# Patient Record
Sex: Male | Born: 1955 | Race: White | Hispanic: No | Marital: Single | Smoking: Never smoker
Health system: Southern US, Community
[De-identification: ages and names within clinical notes are randomized; demographics above are authoritative.]

## PROBLEM LIST (undated history)

## (undated) DIAGNOSIS — C61 Malignant neoplasm of prostate: Principal | ICD-10-CM

## (undated) DIAGNOSIS — R748 Abnormal levels of other serum enzymes: Principal | ICD-10-CM

## (undated) DIAGNOSIS — K2970 Gastritis, unspecified, without bleeding: Secondary | ICD-10-CM

## (undated) DIAGNOSIS — K649 Unspecified hemorrhoids: Secondary | ICD-10-CM

## (undated) DIAGNOSIS — K2980 Duodenitis without bleeding: Secondary | ICD-10-CM

## (undated) DIAGNOSIS — K209 Esophagitis, unspecified without bleeding: Secondary | ICD-10-CM

## (undated) DIAGNOSIS — K921 Melena: Secondary | ICD-10-CM

## (undated) DIAGNOSIS — E079 Disorder of thyroid, unspecified: Secondary | ICD-10-CM

## (undated) DIAGNOSIS — D369 Benign neoplasm, unspecified site: Secondary | ICD-10-CM

## (undated) DIAGNOSIS — K5790 Diverticulosis of intestine, part unspecified, without perforation or abscess without bleeding: Secondary | ICD-10-CM

## (undated) DIAGNOSIS — K219 Gastro-esophageal reflux disease without esophagitis: Secondary | ICD-10-CM

## (undated) HISTORY — DX: Melena: K92.1

## (undated) HISTORY — DX: Disorder of thyroid, unspecified: E07.9

## (undated) HISTORY — PX: TONSILECTOMY, ADENOIDECTOMY, BILATERAL MYRINGOTOMY AND TUBES: SHX2538

## (undated) HISTORY — PX: COLONOSCOPY: SHX174

## (undated) HISTORY — DX: Esophagitis, unspecified without bleeding: K20.90

## (undated) HISTORY — DX: Benign neoplasm, unspecified site: D36.9

## (undated) HISTORY — PX: UPPER GASTROINTESTINAL ENDOSCOPY: SHX188

## (undated) HISTORY — DX: Unspecified hemorrhoids: K64.9

## (undated) HISTORY — PX: HERNIA REPAIR: SHX51

## (undated) HISTORY — DX: Duodenitis without bleeding: K29.80

## (undated) HISTORY — DX: Gastritis, unspecified, without bleeding: K29.70

## (undated) HISTORY — DX: Gastro-esophageal reflux disease without esophagitis: K21.9

## (undated) HISTORY — DX: Diverticulosis of intestine, part unspecified, without perforation or abscess without bleeding: K57.90

---

## 1999-07-27 ENCOUNTER — Ambulatory Visit (HOSPITAL_COMMUNITY): Admission: RE | Admit: 1999-07-27 | Discharge: 1999-07-27 | Payer: Self-pay | Admitting: Gastroenterology

## 1999-10-02 ENCOUNTER — Ambulatory Visit (HOSPITAL_COMMUNITY): Admission: RE | Admit: 1999-10-02 | Discharge: 1999-10-02 | Payer: Self-pay | Admitting: Gastroenterology

## 2001-09-06 ENCOUNTER — Ambulatory Visit (HOSPITAL_COMMUNITY): Admission: RE | Admit: 2001-09-06 | Discharge: 2001-09-06 | Payer: Self-pay | Admitting: Surgery

## 2003-07-24 ENCOUNTER — Ambulatory Visit (HOSPITAL_COMMUNITY): Admission: RE | Admit: 2003-07-24 | Discharge: 2003-07-24 | Payer: Self-pay | Admitting: Gastroenterology

## 2013-01-22 ENCOUNTER — Ambulatory Visit (INDEPENDENT_AMBULATORY_CARE_PROVIDER_SITE_OTHER): Payer: BC Managed Care – PPO | Admitting: Family Medicine

## 2013-01-22 ENCOUNTER — Ambulatory Visit: Payer: BC Managed Care – PPO

## 2013-01-22 VITALS — BP 102/58 | HR 68 | Temp 98.3°F | Resp 16 | Ht 68.0 in | Wt 142.2 lb

## 2013-01-22 DIAGNOSIS — R059 Cough, unspecified: Secondary | ICD-10-CM

## 2013-01-22 DIAGNOSIS — H6121 Impacted cerumen, right ear: Secondary | ICD-10-CM

## 2013-01-22 DIAGNOSIS — H9193 Unspecified hearing loss, bilateral: Secondary | ICD-10-CM

## 2013-01-22 DIAGNOSIS — R05 Cough: Secondary | ICD-10-CM

## 2013-01-22 DIAGNOSIS — H9190 Unspecified hearing loss, unspecified ear: Secondary | ICD-10-CM

## 2013-01-22 DIAGNOSIS — H6590 Unspecified nonsuppurative otitis media, unspecified ear: Secondary | ICD-10-CM

## 2013-01-22 DIAGNOSIS — H6120 Impacted cerumen, unspecified ear: Secondary | ICD-10-CM

## 2013-01-22 LAB — POCT CBC
GRANULOCYTE PERCENT: 68.6 % (ref 37–80)
HCT, POC: 43.6 % (ref 43.5–53.7)
HEMOGLOBIN: 13.7 g/dL — AB (ref 14.1–18.1)
Lymph, poc: 1.4 (ref 0.6–3.4)
MCH, POC: 29.9 pg (ref 27–31.2)
MCHC: 31.4 g/dL — AB (ref 31.8–35.4)
MCV: 95.2 fL (ref 80–97)
MID (CBC): 0.4 (ref 0–0.9)
MPV: 10 fL (ref 0–99.8)
POC GRANULOCYTE: 3.9 (ref 2–6.9)
POC LYMPH PERCENT: 24.8 %L (ref 10–50)
POC MID %: 6.6 %M (ref 0–12)
Platelet Count, POC: 210 10*3/uL (ref 142–424)
RBC: 4.58 M/uL — AB (ref 4.69–6.13)
RDW, POC: 14.1 %
WBC: 5.7 10*3/uL (ref 4.6–10.2)

## 2013-01-22 MED ORDER — METHYLPREDNISOLONE 4 MG PO KIT: PACK | ORAL | Status: DC

## 2013-01-22 MED ORDER — CEFDINIR 300 MG PO CAPS: 300.0000 mg | ORAL_CAPSULE | Freq: Two times a day (BID) | ORAL | Status: DC

## 2013-01-22 NOTE — Progress Notes (Addendum)
This chart was scribed for Delman Cheadle, MD by Einar Pheasant, ED Scribe. This patient was seen in room 9 and the patient's care was started at 4:21 PM. Subjective:    Patient ID: Jon Leonard, male    DOB: [birth year], 58 y.o.   MRN: [id number] Chief Complaint  Patient presents with  . Otalgia    both ears    HPI HPI Comments: Jon Leonard is a 58 y.o. male who presents to [facility] complaining of ears feeling congested that started 1 week ago. He states that he had a URI and sinus cong about a month ago, then got the flu over the christmas w/ full blown f/c/myalgias.  He has been taking OTC medication with some relief and everything has largely improved other than worsening hearing loss. He denies sinus pressure, ear pain, fever, chills, and sore throat. He still has a little cough and chest congestion but that is improving as well.  Pt states that he has been prescribed amoxicillin before with no allergic reactions.  Pcn allergy was when he was a young child.  History reviewed. No pertinent past medical history. Allergies  Allergen Reactions  . Penicillins    No current outpatient prescriptions on file prior to visit.   No current facility-administered medications on file prior to visit.    Review of Systems  Constitutional: Negative for fever, chills, diaphoresis, activity change, appetite change, fatigue and unexpected weight change.  HENT: Positive for congestion, ear discharge and hearing loss. Negative for ear pain, facial swelling, mouth sores, postnasal drip, rhinorrhea, sinus pressure and sore throat.   Respiratory: Positive for cough. Negative for chest tightness, shortness of breath and wheezing.   Cardiovascular: Negative for chest pain.  Neurological: Positive for headaches. Negative for syncope.  Hematological: Negative for adenopathy.      Triage vitals: BP 102/58  Pulse 68  Temp(Src) 98.3 F (36.8 C) (Oral)  Resp 16  Ht 5\' 8"  (1.727 m)   Wt 142 lb 3.2 oz (64.501 kg)  BMI 21.63 kg/m2  SpO2 97% Objective:   Physical Exam  Nursing note and vitals reviewed. Constitutional: He is oriented to person, place, and time. He appears well-developed and well-nourished.  HENT:  Head: Normocephalic and atraumatic.  Right Ear: Tympanic membrane is injected, erythematous and retracted. A middle ear effusion is present.  Left Ear: Tympanic membrane is injected and retracted. A middle ear effusion is present.  Cardiovascular: Normal rate.   Pulmonary/Chest: Effort normal. He has wheezes. He has rhonchi in the left lower field. He has no rales.  Inspiratory and expiratory wheezing worse bibasillarly.  Abdominal: He exhibits no distension.  Lymphadenopathy:    He has no cervical adenopathy.    He has no axillary adenopathy.  Neurological: He is alert and oriented to person, place, and time.  Skin: Skin is warm and dry.  Psychiatric: He has a normal mood and affect.     Results for orders placed in visit on 01/22/13  POCT CBC      Result Value Range   WBC 5.7  4.6 - 10.2 K/uL   Lymph, poc 1.4  0.6 - 3.4   POC LYMPH PERCENT 24.8  10 - 50 %L   MID (cbc) 0.4  0 - 0.9   POC MID % 6.6  0 - 12 %M   POC Granulocyte 3.9  2 - 6.9   Granulocyte percent 68.6  37 - 80 %G   RBC 4.58 (*) 4.69 -  6.13 M/uL   Hemoglobin 13.7 (*) 14.1 - 18.1 g/dL   HCT, POC 43.6  43.5 - 53.7 %   MCV 95.2  80 - 97 fL   MCH, POC 29.9  27 - 31.2 pg   MCHC 31.4 (*) 31.8 - 35.4 g/dL   RDW, POC 14.1     Platelet Count, POC 210  142 - 424 K/uL   MPV 10.0  0 - 99.8 fL    Primary Chest X-ray reading by Dr. Brigitte Pulse No acute abnormalities    EXAM: CHEST 2 VIEW  COMPARISON: None.  FINDINGS: The heart size and mediastinal contours are within normal limits. Both lungs are clear. No pleural effusion or pneumothorax is noted. Old left rib fractures are noted.  IMPRESSION: No active cardiopulmonary disease.  Assessment & Plan:  Cough - Plan: POCT CBC, DG Chest 2  View  Hearing loss, bilateral  Cerumen impaction, right - cerumen completely removed by lavage.  Serous otitis media, left and otitis media right - omnicef  Eustachian tube dysfunction - medrol dose pack  Meds ordered this encounter  Medications  . methylPREDNISolone (MEDROL, PAK,) 4 MG tablet    Sig: follow package directions    Dispense:  21 tablet    Refill:  0  . cefdinir (OMNICEF) 300 MG capsule    Sig: Take 1 capsule (300 mg total) by mouth 2 (two) times daily.    Dispense:  20 capsule    Refill:  0    I personally performed the services described in this documentation, which was scribed in my presence. The recorded information has been reviewed and considered, and addended by me as needed.  Delman Cheadle, MD MPH

## 2013-01-22 NOTE — Patient Instructions (Signed)
Otitis Media, Adult Otitis media is redness, soreness, and swelling (inflammation) of the middle ear. Otitis media may be caused by allergies or, most commonly, by infection. Often it occurs as a complication of the common cold. SIGNS AND SYMPTOMS Symptoms of otitis media may include:  Earache.  Fever.  Ringing in your ear.  Headache.  Leakage of fluid from the ear. DIAGNOSIS To diagnose otitis media, your health care provider will examine your ear with an otoscope. This is an instrument that allows your health care provider to see into your ear in order to examine your eardrum. Your health care provider also will ask you questions about your symptoms. TREATMENT  Typically, otitis media resolves on its own within 3 5 days. Your health care provider may prescribe medicine to ease your symptoms of pain. If otitis media does not resolve within 5 days or is recurrent, your health care provider may prescribe antibiotic medicines if he or she suspects that a bacterial infection is the cause. HOME CARE INSTRUCTIONS   Take your medicine as directed until it is gone, even if you feel better after the first few days.  Only take over-the-counter or prescription medicines for pain, discomfort, or fever as directed by your health care provider.  Follow up with your health care provider as directed. SEEK MEDICAL CARE IF:  You have otitis media only in one ear or bleeding from your nose or both.  You notice a lump on your neck.  You are not getting better in 3 5 days.  You feel worse instead of better. SEEK IMMEDIATE MEDICAL CARE IF:   You have pain that is not controlled with medicine.  You have swelling, redness, or pain around your ear or stiffness in your neck.  You notice that part of your face is paralyzed.  You notice that the bone behind your ear (mastoid) is tender when you touch it. MAKE SURE YOU:   Understand these instructions.  Will watch your condition.  Will get help  right away if you are not doing well or get worse. Document Released: 10/03/2003 Document Revised: 10/18/2012 Document Reviewed: 07/25/2012 [facility] Patient Information 2014 [place], [place].   Otitis Media With Effusion Otitis media with effusion is the presence of fluid in the middle ear. This is a common problem in children, which often follows ear infections. It may be present for weeks or longer after the infection. Unlike an acute ear infection, otitis media with effusion refers only to fluid behind the ear drum and not infection. Children with repeated ear and sinus infections and allergy problems are the most likely to get otitis media with effusion. CAUSES  The most frequent cause of the fluid buildup is dysfunction of the eustachian tubes. These are the tubes that drain fluid in the ears to the to the back of the nose (nasopharynx). SYMPTOMS   The main symptom of this condition is hearing loss. As a result, you or your child may:  Listen to the TV at a loud volume.  Not respond to questions.  Ask "what" often when spoken to.  Mistake or confuse on sound or word for another.  There may be a sensation of fullness or pressure but usually not pain. DIAGNOSIS   Your health care provider will diagnose this condition by examining you or your child's ears.  Your health care provider may test the pressure in you or your child's ear with a tympanometer.  A hearing test may be conducted if the problem persists. TREATMENT  Treatment depends on the duration and the effects of the effusion.  Antibiotics, decongestants, nose drops, and cortisone-type drugs (tablets or nasal spray) may not be helpful.  Children with persistent ear effusions may have delayed language or behavioral problems. Children at risk for developmental delays in hearing, learning, and speech may require referral to a specialist earlier than children not at risk.  You or your child's health care provider may  suggest a referral to an ear, nose, and throat surgeon for treatment. The following may help restore normal hearing:  Drainage of fluid.  Placement of ear tubes (tympanostomy tubes).  Removal of adenoids (adenoidectomy). HOME CARE INSTRUCTIONS   Avoid second hand smoke.  Infants who are breast fed are less likely to have this condition.  Avoid feeding infants while laying flat.  Avoid known environmental allergens.  Avoid people who are sick. SEEK MEDICAL CARE IF:   Hearing is not better in 3 months.  Hearing is worse.  Ear pain.  Drainage from the ear.  Dizziness. MAKE SURE YOU:   Understand these instructions.  Will watch your condition.  Will get help right away if you are not doing well or get worse. Document Released: 02/05/2004 Document Revised: 10/18/2012 Document Reviewed: 07/25/2012 [facility] Patient Information 2014 [place], [place].   Serous Otitis Media  Serous otitis media is fluid in the middle ear space. This space contains the bones for hearing and air. Air in the middle ear space helps to transmit sound.  The air gets there through the eustachian tube. This tube goes from the back of the nose (nasopharynx) to the middle ear space. It keeps the pressure in the middle ear the same as the outside world. It also helps to drain fluid from the middle ear space. CAUSES  Serous otitis media occurs when the eustachian tube gets blocked. Blockage can come from:  Ear infections.  Colds and other upper respiratory infections.  Allergies.  Irritants such as cigarette smoke.  Sudden changes in air pressure (such as descending in an airplane).  Enlarged adenoids.  A mass in the nasopharynx. During colds and upper respiratory infections, the middle ear space can become temporarily filled with fluid. This can happen after an ear infection also. Once the infection clears, the fluid will generally drain out of the ear through the eustachian tube. If it does not,  then serous otitis media occurs. SIGNS AND SYMPTOMS   Hearing loss.  A feeling of fullness in the ear, without pain.  Young children may not show any symptoms but may show slight behavioral changes, such as agitation, ear pulling, or crying. DIAGNOSIS  Serous otitis media is diagnosed by an ear exam. Tests may be done to check on the movement of the eardrum. Hearing exams may also be done. TREATMENT  The fluid most often goes away without treatment. If allergy is the cause, allergy treatment may be helpful. Fluid that persists for several months may require minor surgery. A small tube is placed in the eardrum to:  Drain the fluid.  Restore the air in the middle ear space. In certain situations, antibiotics are used to avoid surgery. Surgery may be done to remove enlarged adenoids (if this is the cause). HOME CARE INSTRUCTIONS   Keep children away from tobacco smoke.  Be sure to keep any follow-up appointments. SEEK MEDICAL CARE IF:   Your hearing is not better in 3 months.  Your hearing is worse.  You have ear pain.  You have drainage from the ear.  You have dizziness.  You have serous otitis media only in one ear or have any bleeding from your nose (epistaxis).  You notice a lump on your neck. MAKE SURE YOU:  Understand these instructions.   Will watch your condition.   Will get help right away if you are not doing well or get worse.  Document Released: 03/20/2003 Document Revised: 08/30/2012 Document Reviewed: 07/25/2012 [facility] Patient Information 2014 [place], [place].

## 2014-06-28 IMAGING — CR DG CHEST 2V
2 series · 2 of 2 positions shown · non-contrast
Comparison: None.

CLINICAL DATA: Cough.

EXAM:
CHEST  2 VIEW

[PA]
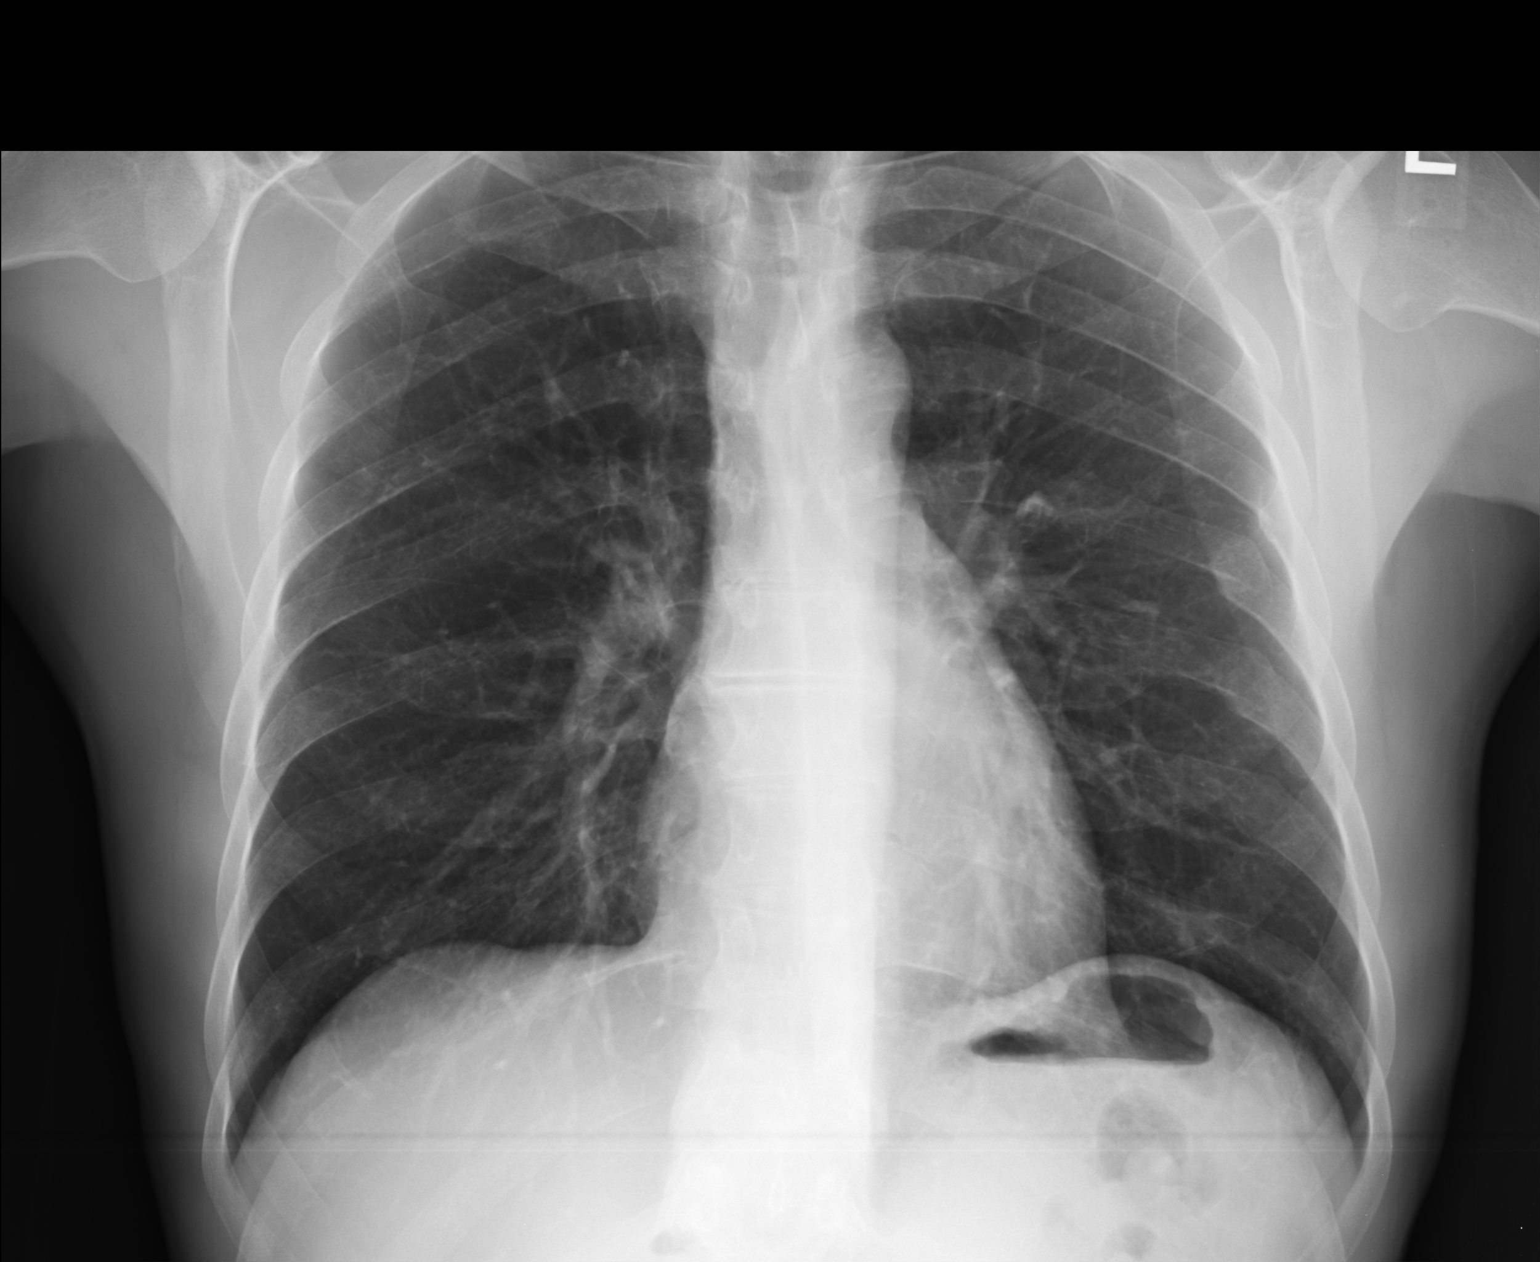

[lateral]
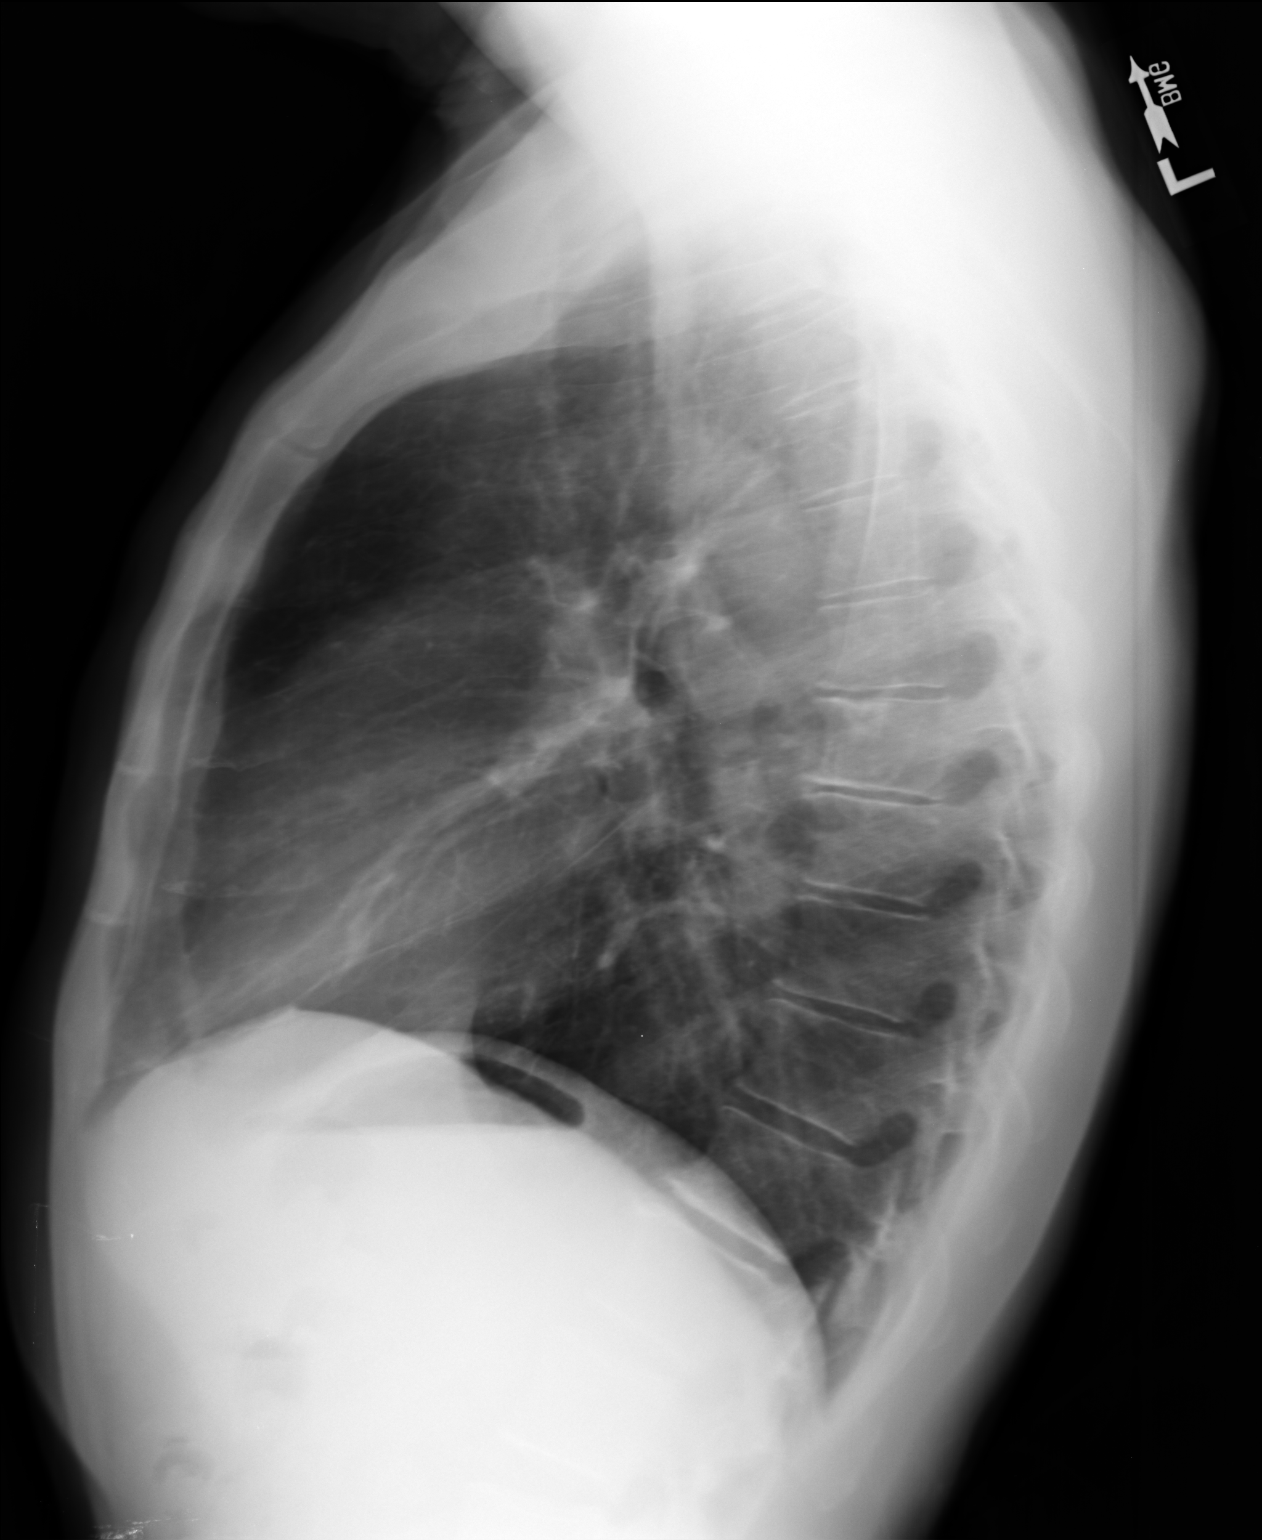

[2 of 2 positions shown; findings below may reference images not displayed]

FINDINGS: The heart size and mediastinal contours are within normal limits.
Both lungs are clear. No pleural effusion or pneumothorax is noted.
Old left rib fractures are noted.
IMPRESSION: No active cardiopulmonary disease.

## 2015-10-10 DIAGNOSIS — Z0000 Encounter for general adult medical examination without abnormal findings: Secondary | ICD-10-CM | POA: Diagnosis not present

## 2015-10-10 DIAGNOSIS — Z125 Encounter for screening for malignant neoplasm of prostate: Secondary | ICD-10-CM | POA: Diagnosis not present

## 2015-10-10 DIAGNOSIS — E038 Other specified hypothyroidism: Secondary | ICD-10-CM | POA: Diagnosis not present

## 2015-10-17 DIAGNOSIS — Z1212 Encounter for screening for malignant neoplasm of rectum: Secondary | ICD-10-CM | POA: Diagnosis not present

## 2015-10-17 DIAGNOSIS — F329 Major depressive disorder, single episode, unspecified: Secondary | ICD-10-CM | POA: Diagnosis not present

## 2015-10-17 DIAGNOSIS — Z0000 Encounter for general adult medical examination without abnormal findings: Secondary | ICD-10-CM | POA: Diagnosis not present

## 2015-10-17 DIAGNOSIS — Z1389 Encounter for screening for other disorder: Secondary | ICD-10-CM | POA: Diagnosis not present

## 2015-10-17 DIAGNOSIS — E038 Other specified hypothyroidism: Secondary | ICD-10-CM | POA: Diagnosis not present

## 2015-10-17 DIAGNOSIS — E784 Other hyperlipidemia: Secondary | ICD-10-CM | POA: Diagnosis not present

## 2015-11-11 DIAGNOSIS — Z23 Encounter for immunization: Secondary | ICD-10-CM | POA: Diagnosis not present

## 2016-01-15 DIAGNOSIS — C44319 Basal cell carcinoma of skin of other parts of face: Secondary | ICD-10-CM | POA: Diagnosis not present

## 2016-01-15 DIAGNOSIS — L309 Dermatitis, unspecified: Secondary | ICD-10-CM | POA: Diagnosis not present

## 2016-01-15 DIAGNOSIS — Z85828 Personal history of other malignant neoplasm of skin: Secondary | ICD-10-CM | POA: Diagnosis not present

## 2016-01-15 DIAGNOSIS — D1801 Hemangioma of skin and subcutaneous tissue: Secondary | ICD-10-CM | POA: Diagnosis not present

## 2016-01-15 DIAGNOSIS — L821 Other seborrheic keratosis: Secondary | ICD-10-CM | POA: Diagnosis not present

## 2016-02-05 DIAGNOSIS — C44319 Basal cell carcinoma of skin of other parts of face: Secondary | ICD-10-CM | POA: Diagnosis not present

## 2016-04-12 DIAGNOSIS — H524 Presbyopia: Secondary | ICD-10-CM | POA: Diagnosis not present

## 2016-04-12 DIAGNOSIS — H5310 Unspecified subjective visual disturbances: Secondary | ICD-10-CM | POA: Diagnosis not present

## 2016-04-12 DIAGNOSIS — H02403 Unspecified ptosis of bilateral eyelids: Secondary | ICD-10-CM | POA: Diagnosis not present

## 2016-04-12 DIAGNOSIS — H4912 Fourth [trochlear] nerve palsy, left eye: Secondary | ICD-10-CM | POA: Diagnosis not present

## 2016-10-12 DIAGNOSIS — E038 Other specified hypothyroidism: Secondary | ICD-10-CM | POA: Diagnosis not present

## 2016-10-12 DIAGNOSIS — Z125 Encounter for screening for malignant neoplasm of prostate: Secondary | ICD-10-CM | POA: Diagnosis not present

## 2016-10-12 DIAGNOSIS — Z0000 Encounter for general adult medical examination without abnormal findings: Secondary | ICD-10-CM | POA: Diagnosis not present

## 2016-10-13 DIAGNOSIS — Z0000 Encounter for general adult medical examination without abnormal findings: Secondary | ICD-10-CM | POA: Diagnosis not present

## 2016-10-18 DIAGNOSIS — Z0000 Encounter for general adult medical examination without abnormal findings: Secondary | ICD-10-CM | POA: Diagnosis not present

## 2016-10-18 DIAGNOSIS — E7849 Other hyperlipidemia: Secondary | ICD-10-CM | POA: Diagnosis not present

## 2016-10-18 DIAGNOSIS — Z1389 Encounter for screening for other disorder: Secondary | ICD-10-CM | POA: Diagnosis not present

## 2016-10-18 DIAGNOSIS — E039 Hypothyroidism, unspecified: Secondary | ICD-10-CM | POA: Diagnosis not present

## 2016-10-18 DIAGNOSIS — Z23 Encounter for immunization: Secondary | ICD-10-CM | POA: Diagnosis not present

## 2016-10-18 DIAGNOSIS — F329 Major depressive disorder, single episode, unspecified: Secondary | ICD-10-CM | POA: Diagnosis not present

## 2016-10-18 DIAGNOSIS — N401 Enlarged prostate with lower urinary tract symptoms: Secondary | ICD-10-CM | POA: Diagnosis not present

## 2016-10-22 DIAGNOSIS — Z1212 Encounter for screening for malignant neoplasm of rectum: Secondary | ICD-10-CM | POA: Diagnosis not present

## 2016-11-15 DIAGNOSIS — Z1212 Encounter for screening for malignant neoplasm of rectum: Secondary | ICD-10-CM | POA: Diagnosis not present

## 2017-01-17 DIAGNOSIS — L570 Actinic keratosis: Secondary | ICD-10-CM | POA: Diagnosis not present

## 2017-01-17 DIAGNOSIS — L821 Other seborrheic keratosis: Secondary | ICD-10-CM | POA: Diagnosis not present

## 2017-01-17 DIAGNOSIS — L723 Sebaceous cyst: Secondary | ICD-10-CM | POA: Diagnosis not present

## 2017-01-17 DIAGNOSIS — D1801 Hemangioma of skin and subcutaneous tissue: Secondary | ICD-10-CM | POA: Diagnosis not present

## 2017-01-17 DIAGNOSIS — Z85828 Personal history of other malignant neoplasm of skin: Secondary | ICD-10-CM | POA: Diagnosis not present

## 2017-04-11 DIAGNOSIS — H0100A Unspecified blepharitis right eye, upper and lower eyelids: Secondary | ICD-10-CM | POA: Diagnosis not present

## 2017-04-11 DIAGNOSIS — H52203 Unspecified astigmatism, bilateral: Secondary | ICD-10-CM | POA: Diagnosis not present

## 2017-04-11 DIAGNOSIS — H0100B Unspecified blepharitis left eye, upper and lower eyelids: Secondary | ICD-10-CM | POA: Diagnosis not present

## 2017-04-11 DIAGNOSIS — H25813 Combined forms of age-related cataract, bilateral: Secondary | ICD-10-CM | POA: Diagnosis not present

## 2017-08-10 DIAGNOSIS — Z85828 Personal history of other malignant neoplasm of skin: Secondary | ICD-10-CM | POA: Diagnosis not present

## 2017-08-10 DIAGNOSIS — L603 Nail dystrophy: Secondary | ICD-10-CM | POA: Diagnosis not present

## 2017-08-10 DIAGNOSIS — M6740 Ganglion, unspecified site: Secondary | ICD-10-CM | POA: Diagnosis not present

## 2017-10-12 DIAGNOSIS — Z125 Encounter for screening for malignant neoplasm of prostate: Secondary | ICD-10-CM | POA: Diagnosis not present

## 2017-10-12 DIAGNOSIS — E038 Other specified hypothyroidism: Secondary | ICD-10-CM | POA: Diagnosis not present

## 2017-10-12 DIAGNOSIS — Z0000 Encounter for general adult medical examination without abnormal findings: Secondary | ICD-10-CM | POA: Diagnosis not present

## 2017-10-19 DIAGNOSIS — Z0000 Encounter for general adult medical examination without abnormal findings: Secondary | ICD-10-CM | POA: Diagnosis not present

## 2017-10-19 DIAGNOSIS — E038 Other specified hypothyroidism: Secondary | ICD-10-CM | POA: Diagnosis not present

## 2017-10-19 DIAGNOSIS — Z1389 Encounter for screening for other disorder: Secondary | ICD-10-CM | POA: Diagnosis not present

## 2017-10-19 DIAGNOSIS — Z23 Encounter for immunization: Secondary | ICD-10-CM | POA: Diagnosis not present

## 2017-10-19 DIAGNOSIS — E7849 Other hyperlipidemia: Secondary | ICD-10-CM | POA: Diagnosis not present

## 2017-10-19 DIAGNOSIS — K219 Gastro-esophageal reflux disease without esophagitis: Secondary | ICD-10-CM | POA: Diagnosis not present

## 2017-10-19 DIAGNOSIS — G479 Sleep disorder, unspecified: Secondary | ICD-10-CM | POA: Diagnosis not present

## 2017-10-25 DIAGNOSIS — Z1212 Encounter for screening for malignant neoplasm of rectum: Secondary | ICD-10-CM | POA: Diagnosis not present

## 2017-11-25 DIAGNOSIS — Z1212 Encounter for screening for malignant neoplasm of rectum: Secondary | ICD-10-CM | POA: Diagnosis not present

## 2017-12-19 DIAGNOSIS — E038 Other specified hypothyroidism: Secondary | ICD-10-CM | POA: Diagnosis not present

## 2017-12-30 ENCOUNTER — Encounter: Payer: Self-pay | Admitting: Gastroenterology

## 2017-12-30 ENCOUNTER — Ambulatory Visit: Payer: BLUE CROSS/BLUE SHIELD | Admitting: Gastroenterology

## 2017-12-30 VITALS — BP 108/64 | HR 78 | Ht 68.0 in | Wt 150.0 lb

## 2017-12-30 DIAGNOSIS — K219 Gastro-esophageal reflux disease without esophagitis: Secondary | ICD-10-CM

## 2017-12-30 DIAGNOSIS — Z86010 Personal history of colonic polyps: Secondary | ICD-10-CM

## 2017-12-30 DIAGNOSIS — Z800 Family history of malignant neoplasm of digestive organs: Secondary | ICD-10-CM

## 2017-12-30 NOTE — Patient Instructions (Addendum)
We will get records sent from your previous gastroenterologist Dr. Amedeo Plenty at [facility] for review.  This will include any endoscopic (colonoscopy or upper endoscopy) procedures and any associated pathology reports.    Do not take protonix.  Please start omeprazole 20mg  pills (OTC) one pill shortly before a meal daily.  Dr. Ardis Hughs to decide timing of EGD +/- colonoscopy depending on review of the above records.  Thank you for entrusting me with your care and choosing [facility].  Dr Ardis Hughs

## 2017-12-30 NOTE — Progress Notes (Signed)
HPI: This is a very pleasant 62 year old man who was referred to me Dr. Reynaldo Minium for GERD, epigastric pain, history of colon polyps  Chief complaint is GERD, epigastric pain, history of colon polyps   Had been seeing Dr. Amedeo Plenty for 20 years.  He's had numerous polyps in his colon, Aunt with colon cancer.  His most recent was in 2014.  He was told by [facility] to have a repeat colonoscopy in 2017.  He's had EGDs in the past, recalls having ulcers in the past and was treated for bacteria.  Epigastric burning intermittent for the past 4 or 5 months..  Depends on what he eats.  Gasex helps.  Daily.  On a liquid anti acid from [facility].  OTC, doesn't help much.  Was prescribed protonix but never started them.  He's also having left sided chest pains that also improve with passing gas.  He tells me he had some false positive stool testing by his primary care physician.  He tells me he gets stool testing every year from his primary care physician  Overall stable weight.  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  This disrupts his bowel patterns.  No overt bleeding.  No dysphagia.    Review of systems: Pertinent positive and negative review of systems were noted in the above HPI section. All other review negative.   Past Medical History:  Diagnosis Date  . GERD (gastroesophageal reflux disease)     Past Surgical History:  Procedure Laterality Date  . TONSILECTOMY, ADENOIDECTOMY, BILATERAL MYRINGOTOMY AND TUBES      Current Outpatient Medications  Medication Sig Dispense Refill  . calcium carbonate (TUMS - DOSED IN MG ELEMENTAL CALCIUM) 500 MG chewable tablet Chew 1 tablet by mouth daily.    Marland Kitchen levothyroxine (SYNTHROID, LEVOTHROID) 25 MCG tablet Take 25 mcg by mouth daily before breakfast.    . Omega-3 Fatty Acids (FISH OIL) 1000 MG CAPS Take by mouth daily.    . Simethicone (GAS-X PO) Take by mouth as needed.     No current facility-administered medications for this visit.      Allergies as of 12/30/2017 - Review Complete 12/30/2017  Allergen Reaction Noted  . Penicillins  01/22/2013    History reviewed. No pertinent family history.  Social History   Socioeconomic History  . Marital status: Single    Spouse name: Not on file  . Number of children: Not on file  . Years of education: Not on file  . Highest education level: Not on file  Occupational History  . Not on file  Social Needs  . Financial resource strain: Not on file  . Food insecurity:    Worry: Not on file    Inability: Not on file  . Transportation needs:    Medical: Not on file    Non-medical: Not on file  Tobacco Use  . Smoking status: Never Smoker  . Smokeless tobacco: Never Used  Substance and Sexual Activity  . Alcohol use: No  . Drug use: No  . Sexual activity: Not on file  Lifestyle  . Physical activity:    Days per week: Not on file    Minutes per session: Not on file  . Stress: Not on file  Relationships  . Social connections:    Talks on phone: Not on file    Gets together: Not on file    Attends religious service: Not on file    Active member of club or organization: Not on file  Attends meetings of clubs or organizations: Not on file    Relationship status: Not on file  . Intimate partner violence:    Fear of current or ex partner: Not on file    Emotionally abused: Not on file    Physically abused: Not on file    Forced sexual activity: Not on file  Other Topics Concern  . Not on file  Social History Narrative  . Not on file     Physical Exam: BP 108/64   Pulse 78   Ht 5\' 8"  (1.727 m)   Wt 150 lb (68 kg)   BMI 22.81 kg/m  Constitutional: generally well-appearing Psychiatric: alert and oriented x3 Eyes: extraocular movements intact Mouth: oral pharynx moist, no lesions Neck: supple no lymphadenopathy Cardiovascular: heart regular rate and rhythm Lungs: clear to auscultation bilaterally Abdomen: soft, nontender, nondistended, no obvious  ascites, no peritoneal signs, normal bowel sounds Extremities: no lower extremity edema bilaterally Skin: no lesions on visible extremities   Assessment and plan: 62 y.o. male with GERD without alarm symptoms, history of ulcers, history of colon polyps  First he has a history of what sounds like precancerous colon polyps.  We do not have any of his records from his previous gastroenterologist at [facility] at the time of this visit and so we will send away for them.  I think it is likely he will need repeat colonoscopy in the near future.  He tells me he gets annual stool testing by his primary care physician I explained to him that that is definitely not necessary for him.  He is being over screened.  He can politely declined those in the future.  He has had is a history of colon polyps and a history of colon cancer in his family.  He has mild GERD-like symptoms.  He did not start the proton pump inhibitor that was prescribed to him by his primary care physician.  This does not like medicines.  I recommended he try an over-the-counter strength proton pump inhibitor and wrote down omeprazole for him 20 mg to take shortly before meals on a daily basis.  I suspect this will help his chronic GERD issues.  Given his history of possible ulcers, possible H. pylori infection that he describes he will probably need EGD but I want to wait until I able to see his records from his previous gastroenterologist before committing to either colonoscopy or upper endoscopy.    Please see the "Patient Instructions" section for addition details about the plan.   Owens Loffler, MD [facility] 12/30/2017, 11:02 AM  Cc Burnard Bunting, MD

## 2018-01-17 ENCOUNTER — Telehealth: Payer: Self-pay | Admitting: Gastroenterology

## 2018-01-17 NOTE — Telephone Encounter (Signed)
EGD September 2001, Dr. Teena Irani at [facility] gastroenterology, indications "epigastric abdominal pain, a several day history of black stools, with heme positive stools confirmed on exam today".  Findings duodenal ulcer with duodenitis, gastritis, moderately severe esophagitis.  CLOtest was positive.  Colonoscopy July 2010, Dr. Teena Irani at [facility] gastroenterology.  Indication family history of colon cancer, personal history of colon polyps.  Findings 5 mm polyp in the descending colon was removed.  This was an adenomatous polyp on pathology.  Also left sided diverticulosis.  Colonoscopy December 2014, Dr. Teena Irani at [facility] gastroenterology, indications "heme positive stool, personal history of colon polyps".  Findings; 4 subcentimeter polyps were removed throughout the colon.  These were all tubular adenomas.  Diverticulosis was noted throughout the colon.   Patty, Can you please contact him.  Let him know that I reviewed his records from Dr. Teena Irani.  It does look like he needs repeat colonoscopy around now for multiple polyps removed in 2014.  Given his symptoms and his history of H. pylori, duodenal ulcer, he should also have a EGD at the same time.  Thank you

## 2018-01-17 NOTE — Telephone Encounter (Signed)
The pt states he is waiting for his insurance approval to come thru.  He will call back to set up procedures.

## 2018-05-29 DIAGNOSIS — H43812 Vitreous degeneration, left eye: Secondary | ICD-10-CM | POA: Diagnosis not present

## 2018-06-12 DIAGNOSIS — H43812 Vitreous degeneration, left eye: Secondary | ICD-10-CM | POA: Diagnosis not present

## 2018-08-16 DIAGNOSIS — H4912 Fourth [trochlear] nerve palsy, left eye: Secondary | ICD-10-CM | POA: Diagnosis not present

## 2018-08-16 DIAGNOSIS — H0100A Unspecified blepharitis right eye, upper and lower eyelids: Secondary | ICD-10-CM | POA: Diagnosis not present

## 2018-08-16 DIAGNOSIS — H25813 Combined forms of age-related cataract, bilateral: Secondary | ICD-10-CM | POA: Diagnosis not present

## 2018-08-16 DIAGNOSIS — H524 Presbyopia: Secondary | ICD-10-CM | POA: Diagnosis not present

## 2018-10-12 DIAGNOSIS — Z23 Encounter for immunization: Secondary | ICD-10-CM | POA: Diagnosis not present

## 2018-10-18 DIAGNOSIS — E785 Hyperlipidemia, unspecified: Secondary | ICD-10-CM | POA: Diagnosis not present

## 2018-10-18 DIAGNOSIS — N401 Enlarged prostate with lower urinary tract symptoms: Secondary | ICD-10-CM | POA: Diagnosis not present

## 2018-10-18 DIAGNOSIS — Z0000 Encounter for general adult medical examination without abnormal findings: Secondary | ICD-10-CM | POA: Diagnosis not present

## 2018-10-18 DIAGNOSIS — E039 Hypothyroidism, unspecified: Secondary | ICD-10-CM | POA: Diagnosis not present

## 2018-10-25 DIAGNOSIS — E039 Hypothyroidism, unspecified: Secondary | ICD-10-CM | POA: Diagnosis not present

## 2018-10-25 DIAGNOSIS — E785 Hyperlipidemia, unspecified: Secondary | ICD-10-CM | POA: Diagnosis not present

## 2018-10-25 DIAGNOSIS — M25512 Pain in left shoulder: Secondary | ICD-10-CM | POA: Diagnosis not present

## 2018-10-25 DIAGNOSIS — Z0000 Encounter for general adult medical examination without abnormal findings: Secondary | ICD-10-CM | POA: Diagnosis not present

## 2018-10-25 DIAGNOSIS — M6749 Ganglion, multiple sites: Secondary | ICD-10-CM | POA: Diagnosis not present

## 2018-10-25 DIAGNOSIS — Z23 Encounter for immunization: Secondary | ICD-10-CM | POA: Diagnosis not present

## 2018-10-26 DIAGNOSIS — Z1212 Encounter for screening for malignant neoplasm of rectum: Secondary | ICD-10-CM | POA: Diagnosis not present

## 2019-01-15 ENCOUNTER — Encounter: Payer: Self-pay | Admitting: Internal Medicine

## 2019-02-19 DIAGNOSIS — Z85828 Personal history of other malignant neoplasm of skin: Secondary | ICD-10-CM | POA: Diagnosis not present

## 2019-02-19 DIAGNOSIS — C44519 Basal cell carcinoma of skin of other part of trunk: Secondary | ICD-10-CM | POA: Diagnosis not present

## 2019-02-19 DIAGNOSIS — L821 Other seborrheic keratosis: Secondary | ICD-10-CM | POA: Diagnosis not present

## 2019-02-19 DIAGNOSIS — D485 Neoplasm of uncertain behavior of skin: Secondary | ICD-10-CM | POA: Diagnosis not present

## 2019-02-19 DIAGNOSIS — L570 Actinic keratosis: Secondary | ICD-10-CM | POA: Diagnosis not present

## 2019-04-03 DIAGNOSIS — R05 Cough: Secondary | ICD-10-CM | POA: Diagnosis not present

## 2019-04-03 DIAGNOSIS — J069 Acute upper respiratory infection, unspecified: Secondary | ICD-10-CM | POA: Diagnosis not present

## 2019-04-03 DIAGNOSIS — Z1152 Encounter for screening for COVID-19: Secondary | ICD-10-CM | POA: Diagnosis not present

## 2019-05-07 DIAGNOSIS — Z0120 Encounter for dental examination and cleaning without abnormal findings: Secondary | ICD-10-CM | POA: Diagnosis not present

## 2019-07-24 ENCOUNTER — Telehealth: Payer: Self-pay | Admitting: Gastroenterology

## 2019-07-24 NOTE — Telephone Encounter (Signed)
Pt wants to make sure that his records from [facility]

## 2019-08-15 DIAGNOSIS — H524 Presbyopia: Secondary | ICD-10-CM | POA: Diagnosis not present

## 2019-08-23 ENCOUNTER — Telehealth: Payer: Self-pay | Admitting: Gastroenterology

## 2019-08-23 NOTE — Telephone Encounter (Signed)
Rec'd from [facility] forwarded 14 pages to Dr. Owens Loffler P

## 2019-08-29 ENCOUNTER — Encounter: Payer: Self-pay | Admitting: Gastroenterology

## 2019-09-10 DIAGNOSIS — Z0120 Encounter for dental examination and cleaning without abnormal findings: Secondary | ICD-10-CM | POA: Diagnosis not present

## 2019-10-03 DIAGNOSIS — R5383 Other fatigue: Secondary | ICD-10-CM | POA: Diagnosis not present

## 2019-10-03 DIAGNOSIS — J302 Other seasonal allergic rhinitis: Secondary | ICD-10-CM | POA: Diagnosis not present

## 2019-10-03 DIAGNOSIS — J018 Other acute sinusitis: Secondary | ICD-10-CM | POA: Diagnosis not present

## 2019-10-03 DIAGNOSIS — E039 Hypothyroidism, unspecified: Secondary | ICD-10-CM | POA: Diagnosis not present

## 2019-10-22 DIAGNOSIS — E785 Hyperlipidemia, unspecified: Secondary | ICD-10-CM | POA: Diagnosis not present

## 2019-10-22 DIAGNOSIS — E039 Hypothyroidism, unspecified: Secondary | ICD-10-CM | POA: Diagnosis not present

## 2019-10-22 DIAGNOSIS — Z125 Encounter for screening for malignant neoplasm of prostate: Secondary | ICD-10-CM | POA: Diagnosis not present

## 2019-10-29 DIAGNOSIS — R82998 Other abnormal findings in urine: Secondary | ICD-10-CM | POA: Diagnosis not present

## 2019-10-29 DIAGNOSIS — E039 Hypothyroidism, unspecified: Secondary | ICD-10-CM | POA: Diagnosis not present

## 2019-10-29 DIAGNOSIS — Z0000 Encounter for general adult medical examination without abnormal findings: Secondary | ICD-10-CM | POA: Diagnosis not present

## 2019-10-29 DIAGNOSIS — E785 Hyperlipidemia, unspecified: Secondary | ICD-10-CM | POA: Diagnosis not present

## 2019-10-29 DIAGNOSIS — M25511 Pain in right shoulder: Secondary | ICD-10-CM | POA: Diagnosis not present

## 2019-10-31 ENCOUNTER — Ambulatory Visit: Payer: Medicare Other | Admitting: Gastroenterology

## 2019-10-31 ENCOUNTER — Encounter: Payer: Self-pay | Admitting: Gastroenterology

## 2019-10-31 DIAGNOSIS — K219 Gastro-esophageal reflux disease without esophagitis: Secondary | ICD-10-CM

## 2019-10-31 DIAGNOSIS — Z86010 Personal history of colonic polyps: Secondary | ICD-10-CM | POA: Diagnosis not present

## 2019-10-31 DIAGNOSIS — Z800 Family history of malignant neoplasm of digestive organs: Secondary | ICD-10-CM | POA: Diagnosis not present

## 2019-10-31 MED ORDER — SUPREP BOWEL PREP KIT 17.5-3.13-1.6 GM/177ML PO SOLN: 1.0000 | ORAL | 0 refills | Status: DC

## 2019-10-31 NOTE — Patient Instructions (Addendum)
If you are age 65 or older, your body mass index should be between 23-30. Your Body mass index is 22.2 kg/m. If this is out of the aforementioned range listed, please consider follow up with your Primary Care Provider.  If you are age 90 or younger, your body mass index should be between 19-25. Your Body mass index is 22.2 kg/m. If this is out of the aformentioned range listed, please consider follow up with your Primary Care Provider.   You have been scheduled for a colonoscopy. Please follow written instructions given to you at your visit today.  Please pick up your prep supplies at the pharmacy within the next 1-3 days. If you use inhalers (even only as needed), please bring them with you on the day of your procedure.  Due to recent changes in healthcare laws, you may see the results of your imaging and laboratory studies on MyChart before your provider has had a chance to review them.  We understand that in some cases there may be results that are confusing or concerning to you. Not all laboratory results come back in the same time frame and the provider may be waiting for multiple results in order to interpret others.  Please give [place] 48 hours in order for your provider to thoroughly review all the results before contacting the office for clarification of your results.   Thank you for entrusting me with your care and choosing [facility].  Dr Ardis Hughs

## 2019-10-31 NOTE — Progress Notes (Signed)
HPI: This is a very pleasant 64 year old man  I last saw Mr. Jon Leonard almost 2 years ago here in the office.  That was for epigastric pain, GERD, history of colon polyps.  He had had numerous polyps in his colon.  His most recent colonoscopy was in 2014 with Dr. Amedeo Plenty from [facility] gastroenterology.  He explained that he gets Hemoccult testing from his primary care physician on an annual basis.  EGD September 2001, Dr. Teena Irani at [facility] gastroenterology, indications "epigastric abdominal pain, a several day history of black stools, with heme positive stools confirmed on exam today".  Findings duodenal ulcer with duodenitis, gastritis, moderately severe esophagitis.  CLOtest was positive.  Colonoscopy July 2010, Dr. Teena Irani at [facility] gastroenterology.  Indication family history of colon cancer, personal history of colon polyps.  Findings 5 mm polyp in the descending colon was removed.  This was an adenomatous polyp on pathology.  Also left sided diverticulosis.  Colonoscopy December 2014, Dr. Teena Irani at [facility] gastroenterology, indications "heme positive stool, personal history of colon polyps".  Findings; 4 subcentimeter polyps were removed throughout the colon.    These were all tubular adenomas.  I recommended that he have a repeat colonoscopy and also upper endoscopy.  He then changed care to Dr. Earlean Shawl.  We really have not heard from him since then, 2 years ago.  Currently he is here and he tells me he has intermittent heartburn.  This occurs about once every 2 or 3 weeks and he takes omeprazole and it helps.  He has no dysphagia, no weight loss, no overt GI bleeding.  He has no troubles with his bowels.  No significant constipation diarrhea or overt bleeding.  ROS: complete GI ROS as described in HPI, all other review negative.  Constitutional:  No unintentional weight loss   Past Medical History:  Diagnosis Date  . Blood in stool   . Diverticulosis   . Duodenitis   .  Esophagitis   . Gastritis   . GERD (gastroesophageal reflux disease)   . Hemorrhoids   . Tubular adenoma     Past Surgical History:  Procedure Laterality Date  . COLONOSCOPY    . HERNIA REPAIR    . TONSILECTOMY, ADENOIDECTOMY, BILATERAL MYRINGOTOMY AND TUBES    . UPPER GASTROINTESTINAL ENDOSCOPY      Current Outpatient Medications  Medication Sig Dispense Refill  . calcium carbonate (TUMS - DOSED IN MG ELEMENTAL CALCIUM) 500 MG chewable tablet Chew 1 tablet by mouth daily.    Marland Kitchen levothyroxine (SYNTHROID, LEVOTHROID) 25 MCG tablet Take 25 mcg by mouth daily before breakfast.    . omeprazole (PRILOSEC) 20 MG capsule Take 20 mg by mouth daily.     No current facility-administered medications for this visit.    Allergies as of 10/31/2019 - Review Complete 10/31/2019  Allergen Reaction Noted  . Penicillins  01/22/2013    Family History  Problem Relation Age of Onset  . Colon cancer Father   . Diabetes Sister     Social History   Socioeconomic History  . Marital status: Single    Spouse name: Not on file  . Number of children: Not on file  . Years of education: Not on file  . Highest education level: Not on file  Occupational History  . Not on file  Tobacco Use  . Smoking status: Never Smoker  . Smokeless tobacco: Never Used  Vaping Use  . Vaping Use: Never used  Substance and Sexual Activity  .  Alcohol use: Yes    Comment: occ  . Drug use: No  . Sexual activity: Not on file  Other Topics Concern  . Not on file  Social History Narrative  . Not on file   Social Determinants of Health   Financial Resource Strain:   . Difficulty of Paying Living Expenses: Not on file  Food Insecurity:   . Worried About Charity fundraiser in the Last Year: Not on file  . Ran Out of Food in the Last Year: Not on file  Transportation Needs:   . Lack of Transportation (Medical): Not on file  . Lack of Transportation (Non-Medical): Not on file  Physical Activity:   . Days of  Exercise per Week: Not on file  . Minutes of Exercise per Session: Not on file  Stress:   . Feeling of Stress : Not on file  Social Connections:   . Frequency of Communication with Friends and Family: Not on file  . Frequency of Social Gatherings with Friends and Family: Not on file  . Attends Religious Services: Not on file  . Active Member of Clubs or Organizations: Not on file  . Attends Archivist Meetings: Not on file  . Marital Status: Not on file  Intimate Partner Violence:   . Fear of Current or Ex-Partner: Not on file  . Emotionally Abused: Not on file  . Physically Abused: Not on file  . Sexually Abused: Not on file     Physical Exam: BP 104/70   Pulse (!) 53   Ht 5\' 8"  (1.727 m)   Wt 146 lb (66.2 kg)   BMI 22.20 kg/m  Constitutional: generally well-appearing Psychiatric: alert and oriented x3 Abdomen: soft, nontender, nondistended, no obvious ascites, no peritoneal signs, normal bowel sounds No peripheral edema noted in lower extremities  Assessment and plan: 64 y.o. male with family history colon cancer, personal history of adenomatous polyps, GERD without alarm symptoms  His father was diagnosed with colon cancer in his 5s.  He is still alive at the age of 37.  His last colonoscopy was 2014 and he had 4 subcentimeter adenomas removed.  I recommended he have another colonoscopy now, just like I did 2 years ago when we discussed this.  He has GERD without alarm symptoms.  He will continue omeprazole on an as-needed basis as this seems to work quite well for him.  Please see the "Patient Instructions" section for addition details about the plan.  Owens Loffler, MD [facility] 10/31/2019, 9:29 AM   Total time on date of encounter was 35 minutes (this included time spent preparing to see the patient reviewing records; obtaining and/or reviewing separately obtained history; performing a medically appropriate exam and/or evaluation;  counseling and educating the patient and family if present; ordering medications, tests or procedures if applicable; and documenting clinical information in the health record).

## 2019-11-02 ENCOUNTER — Telehealth: Payer: Self-pay | Admitting: Gastroenterology

## 2019-11-02 NOTE — Telephone Encounter (Signed)
Pt is requesting a call back from a nurse to discuss a coupon he needs for his SUPREP.

## 2019-11-02 NOTE — Telephone Encounter (Signed)
This needs to go to [place] thanks

## 2019-11-05 MED ORDER — PLENVU 140 G PO SOLR: 1.0000 | ORAL | 0 refills | Status: DC

## 2019-11-05 NOTE — Telephone Encounter (Signed)
Per pharmacy Suprep with code is going to cost the patient over $90.  Patient advised that we will give him Plenvu samples.  Instructions explained to the patient in detail.  Printed instructions given to patient in the office.  Patient agreed to plan and verbalized understanding. No further questions.

## 2019-11-05 NOTE — Telephone Encounter (Signed)
Pt calling again to ask about coupon for prep and asks for a call back.

## 2019-11-07 ENCOUNTER — Encounter: Payer: Self-pay | Admitting: Gastroenterology

## 2019-11-07 ENCOUNTER — Other Ambulatory Visit: Payer: Self-pay

## 2019-11-07 ENCOUNTER — Ambulatory Visit (AMBULATORY_SURGERY_CENTER): Payer: Medicare Other | Admitting: Gastroenterology

## 2019-11-07 VITALS — BP 98/55 | HR 60 | Temp 97.7°F | Resp 15 | Ht 68.0 in | Wt 146.0 lb

## 2019-11-07 DIAGNOSIS — Z800 Family history of malignant neoplasm of digestive organs: Secondary | ICD-10-CM | POA: Diagnosis not present

## 2019-11-07 DIAGNOSIS — D120 Benign neoplasm of cecum: Secondary | ICD-10-CM

## 2019-11-07 DIAGNOSIS — D125 Benign neoplasm of sigmoid colon: Secondary | ICD-10-CM | POA: Diagnosis not present

## 2019-11-07 DIAGNOSIS — D124 Benign neoplasm of descending colon: Secondary | ICD-10-CM

## 2019-11-07 DIAGNOSIS — Z86010 Personal history of colonic polyps: Secondary | ICD-10-CM | POA: Diagnosis not present

## 2019-11-07 MED ORDER — SODIUM CHLORIDE 0.9 % IV SOLN: 500.0000 mL | Freq: Once | INTRAVENOUS | Status: DC

## 2019-11-07 NOTE — Progress Notes (Signed)
Patient ID: Jon Leonard, male   DOB: 1955/01/22, 64 y.o.   MRN: [id number] Pt resting at this time, passing flatus. Denies pain. Reviewed Dc instructions with pt, he verbalized understanding. AVS and handouts provided.

## 2019-11-07 NOTE — Progress Notes (Signed)
Called to room to assist during endoscopic procedure.  Patient ID and intended procedure confirmed with present staff. Received instructions for my participation in the procedure from the performing physician.  

## 2019-11-07 NOTE — Progress Notes (Signed)
pt tolerated well. VSS. awake and to recovery. Report given to RN.  

## 2019-11-07 NOTE — Discharge Instructions (Signed)
Resume previous medications. Handouts on findings given to patient. Await pathology for final recommendations.  YOU HAD AN ENDOSCOPIC PROCEDURE TODAY AT THE  ENDOSCOPY CENTER:   Refer to the procedure report that was given to you for any specific questions about what was found during the examination.  If the procedure report does not answer your questions, please call your gastroenterologist to clarify.  If you requested that your care partner not be given the details of your procedure findings, then the procedure report has been included in a sealed envelope for you to review at your convenience later.  YOU SHOULD EXPECT: Some feelings of bloating in the abdomen. Passage of more gas than usual.  Walking can help get rid of the air that was put into your GI tract during the procedure and reduce the bloating. If you had a lower endoscopy (such as a colonoscopy or flexible sigmoidoscopy) you may notice spotting of blood in your stool or on the toilet paper. If you underwent a bowel prep for your procedure, you may not have a normal bowel movement for a few days.  Please Note:  You might notice some irritation and congestion in your nose or some drainage.  This is from the oxygen used during your procedure.  There is no need for concern and it should clear up in a day or so.  SYMPTOMS TO REPORT IMMEDIATELY:   Following lower endoscopy (colonoscopy or flexible sigmoidoscopy):  Excessive amounts of blood in the stool  Significant tenderness or worsening of abdominal pains  Swelling of the abdomen that is new, acute  Fever of 100F or higher  For urgent or emergent issues, a gastroenterologist can be reached at any hour by calling [phone]. Do not use MyChart messaging for urgent concerns.    DIET:  We do recommend a small meal at first, but then you may proceed to your regular diet.  Drink plenty of fluids but you should avoid alcoholic beverages for 24 hours.  ACTIVITY:  You should  plan to take it easy for the rest of today and you should NOT DRIVE or use heavy machinery until tomorrow (because of the sedation medicines used during the test).    FOLLOW UP: Our staff will call the number listed on your records 48-72 hours following your procedure to check on you and address any questions or concerns that you may have regarding the information given to you following your procedure. If we do not reach you, we will leave a message.  We will attempt to reach you two times.  During this call, we will ask if you have developed any symptoms of COVID 19. If you develop any symptoms (ie: fever, flu-like symptoms, shortness of breath, cough etc.) before then, please call [phone].  If you test positive for Covid 19 in the 2 weeks post procedure, please call and report this information to us.    If any biopsies were taken you will be contacted by phone or by letter within the next 1-3 weeks.  Please call us at [phone] if you have not heard about the biopsies in 3 weeks.    SIGNATURES/CONFIDENTIALITY: You and/or your care partner have signed paperwork which will be entered into your electronic medical record.  These signatures attest to the fact that that the information above on your After Visit Summary has been reviewed and is understood.  Full responsibility of the confidentiality of this discharge information lies with you and/or your care-partner. 

## 2019-11-07 NOTE — Progress Notes (Signed)
VS by JD. 

## 2019-11-07 NOTE — Op Note (Signed)
[place] Patient Name: Jon Leonard Procedure Date: 11/07/2019 9:55 AM MRN: [id number] Endoscopist: Milus Banister , MD Age: 63 Referring MD:  Date of Birth: [birth year] Gender: Male Account #: [id number] Procedure:                Colonoscopy Indications:              High risk colon cancer surveillance: Personal                            history of colonic polyps Medicines:                Monitored Anesthesia Care Procedure:                Pre-Anesthesia Assessment:                           - Prior to the procedure, a History and Physical                            was performed, and patient medications and                            allergies were reviewed. The patient's tolerance of                            previous anesthesia was also reviewed. The risks                            and benefits of the procedure and the sedation                            options and risks were discussed with the patient.                            All questions were answered, and informed consent                            was obtained. Prior Anticoagulants: The patient has                            taken no previous anticoagulant or antiplatelet                            agents. ASA Grade Assessment: II - A patient with                            mild systemic disease. After reviewing the risks                            and benefits, the patient was deemed in                            satisfactory condition to undergo the procedure.  After obtaining informed consent, the colonoscope                            was passed under direct vision. Throughout the                            procedure, the patient's blood pressure, pulse, and                            oxygen saturations were monitored continuously. The                            Colonoscope was introduced through the anus and                            advanced to the the cecum, identified  by                            appendiceal orifice and ileocecal valve. The                            colonoscopy was performed without difficulty. The                            patient tolerated the procedure well. The quality                            of the bowel preparation was good. The ileocecal                            valve, appendiceal orifice, and rectum were                            photographed. Scope In: 10:07:46 AM Scope Out: 10:23:03 AM Scope Withdrawal Time: 0 hours 12 minutes 37 seconds  Total Procedure Duration: 0 hours 15 minutes 17 seconds  Findings:                 Three sessile polyps were found in the descending                            colon and cecum. The polyps were 2 to 3 mm in size.                            These polyps were removed with a cold snare.                            Resection and retrieval were complete.                           A 11 mm polyp was found in the sigmoid colon. The                            polyp was pedunculated. The polyp  was removed with                            a hot snare. Resection and retrieval were complete.                           Multiple small and large-mouthed diverticula were                            found in the left colon.                           The exam was otherwise without abnormality on                            direct and retroflexion views. Complications:            No immediate complications. Estimated blood loss:                            None. Estimated Blood Loss:     Estimated blood loss: none. Impression:               - Three 2 to 3 mm polyps in the descending colon                            and in the cecum, removed with a cold snare.                            Resected and retrieved.                           - One 11 mm polyp in the sigmoid colon, removed                            with a hot snare. Resected and retrieved.                           - Diverticulosis in the left  colon.                           - The examination was otherwise normal on direct                            and retroflexion views. Recommendation:           - Patient has a contact number available for                            emergencies. The signs and symptoms of potential                            delayed complications were discussed with the                            patient. Return to normal activities  tomorrow.                            Written discharge instructions were provided to the                            patient.                           - Resume previous diet.                           - Continue present medications.                           - Await pathology results. Milus Banister, MD 11/07/2019 10:30:23 AM This report has been signed electronically.

## 2019-11-09 ENCOUNTER — Telehealth: Payer: Self-pay | Admitting: *Deleted

## 2019-11-09 NOTE — Telephone Encounter (Signed)
  Follow up Call-  Call back number 11/07/2019  Post procedure Call Back phone  # [phone]  Permission to leave phone message Yes  Some recent data might be hidden     Patient questions:  Do you have a fever, pain , or abdominal swelling? No. Pain Score  0 *  Have you tolerated food without any problems? Yes.    Have you been able to return to your normal activities? Yes.    Do you have any questions about your discharge instructions: Diet   No. Medications  No. Follow up visit  No.  Do you have questions or concerns about your Care? No.  Actions: * If pain score is 4 or above: No action needed, pain <4.  1. Have you developed a fever since your procedure? no  2.   Have you had an respiratory symptoms (SOB or cough) since your procedure? no  3.   Have you tested positive for COVID 19 since your procedure no  4.   Have you had any family members/close contacts diagnosed with the COVID 19 since your procedure?  no   If yes to any of these questions please route to Joylene John, RN and Joella Prince, RN

## 2019-11-13 ENCOUNTER — Encounter: Payer: Self-pay | Admitting: Gastroenterology

## 2020-02-20 DIAGNOSIS — L111 Transient acantholytic dermatosis [Grover]: Secondary | ICD-10-CM | POA: Diagnosis not present

## 2020-02-20 DIAGNOSIS — C44519 Basal cell carcinoma of skin of other part of trunk: Secondary | ICD-10-CM | POA: Diagnosis not present

## 2020-02-20 DIAGNOSIS — L821 Other seborrheic keratosis: Secondary | ICD-10-CM | POA: Diagnosis not present

## 2020-02-20 DIAGNOSIS — Z85828 Personal history of other malignant neoplasm of skin: Secondary | ICD-10-CM | POA: Diagnosis not present

## 2020-02-20 DIAGNOSIS — D1801 Hemangioma of skin and subcutaneous tissue: Secondary | ICD-10-CM | POA: Diagnosis not present

## 2020-05-08 DIAGNOSIS — H52223 Regular astigmatism, bilateral: Secondary | ICD-10-CM | POA: Diagnosis not present

## 2020-11-04 DIAGNOSIS — H2513 Age-related nuclear cataract, bilateral: Secondary | ICD-10-CM | POA: Diagnosis not present

## 2020-11-05 DIAGNOSIS — E785 Hyperlipidemia, unspecified: Secondary | ICD-10-CM | POA: Diagnosis not present

## 2020-11-05 DIAGNOSIS — Z125 Encounter for screening for malignant neoplasm of prostate: Secondary | ICD-10-CM | POA: Diagnosis not present

## 2020-11-05 DIAGNOSIS — E039 Hypothyroidism, unspecified: Secondary | ICD-10-CM | POA: Diagnosis not present

## 2020-11-12 DIAGNOSIS — E039 Hypothyroidism, unspecified: Secondary | ICD-10-CM | POA: Diagnosis not present

## 2020-11-12 DIAGNOSIS — Z0000 Encounter for general adult medical examination without abnormal findings: Secondary | ICD-10-CM | POA: Diagnosis not present

## 2020-11-12 DIAGNOSIS — R82998 Other abnormal findings in urine: Secondary | ICD-10-CM | POA: Diagnosis not present

## 2020-11-12 DIAGNOSIS — K219 Gastro-esophageal reflux disease without esophagitis: Secondary | ICD-10-CM | POA: Diagnosis not present

## 2020-11-12 DIAGNOSIS — Z1212 Encounter for screening for malignant neoplasm of rectum: Secondary | ICD-10-CM | POA: Diagnosis not present

## 2020-11-12 DIAGNOSIS — E785 Hyperlipidemia, unspecified: Secondary | ICD-10-CM | POA: Diagnosis not present

## 2020-11-14 DIAGNOSIS — K921 Melena: Secondary | ICD-10-CM | POA: Diagnosis not present

## 2021-01-30 DIAGNOSIS — R0981 Nasal congestion: Secondary | ICD-10-CM | POA: Diagnosis not present

## 2021-01-30 DIAGNOSIS — J40 Bronchitis, not specified as acute or chronic: Secondary | ICD-10-CM | POA: Diagnosis not present

## 2021-01-30 DIAGNOSIS — J069 Acute upper respiratory infection, unspecified: Secondary | ICD-10-CM | POA: Diagnosis not present

## 2021-01-30 DIAGNOSIS — J029 Acute pharyngitis, unspecified: Secondary | ICD-10-CM | POA: Diagnosis not present

## 2021-02-24 DIAGNOSIS — D1801 Hemangioma of skin and subcutaneous tissue: Secondary | ICD-10-CM | POA: Diagnosis not present

## 2021-02-24 DIAGNOSIS — Z85828 Personal history of other malignant neoplasm of skin: Secondary | ICD-10-CM | POA: Diagnosis not present

## 2021-02-24 DIAGNOSIS — L821 Other seborrheic keratosis: Secondary | ICD-10-CM | POA: Diagnosis not present

## 2021-02-24 DIAGNOSIS — D225 Melanocytic nevi of trunk: Secondary | ICD-10-CM | POA: Diagnosis not present

## 2021-03-19 LAB — PROSTATE SPECIFIC ANTIGEN, TOTAL: PSA: 1.5 ng/ml (ref 0.0–4.0)

## 2021-05-06 ENCOUNTER — Ambulatory Visit: Payer: Medicare Other | Admitting: Physician Assistant

## 2021-05-06 ENCOUNTER — Encounter: Payer: Self-pay | Admitting: Physician Assistant

## 2021-05-06 ENCOUNTER — Other Ambulatory Visit (INDEPENDENT_AMBULATORY_CARE_PROVIDER_SITE_OTHER): Payer: Medicare Other

## 2021-05-06 VITALS — BP 114/62 | HR 67 | Ht 69.0 in | Wt 147.6 lb

## 2021-05-06 DIAGNOSIS — R195 Other fecal abnormalities: Secondary | ICD-10-CM

## 2021-05-06 DIAGNOSIS — Z87898 Personal history of other specified conditions: Secondary | ICD-10-CM | POA: Diagnosis not present

## 2021-05-06 DIAGNOSIS — Z86010 Personal history of colonic polyps: Secondary | ICD-10-CM

## 2021-05-06 LAB — COMPREHENSIVE METABOLIC PANEL
ALT: 14 U/L (ref 0–53)
AST: 18 U/L (ref 0–37)
Albumin: 4.2 g/dL (ref 3.5–5.2)
Alkaline Phosphatase: 60 U/L (ref 39–117)
BUN: 13 mg/dL (ref 6–23)
CO2: 26 mEq/L (ref 19–32)
Calcium: 8.9 mg/dL (ref 8.4–10.5)
Chloride: 104 mEq/L (ref 96–112)
Creatinine, Ser: 0.68 mg/dL (ref 0.40–1.50)
GFR: 96.35 mL/min (ref >60.00)
Glucose, Bld: 128 mg/dL — ABNORMAL HIGH (ref 70–99)
Potassium: 4 mEq/L (ref 3.5–5.1)
Sodium: 137 mEq/L (ref 135–145)
Total Bilirubin: 0.5 mg/dL (ref 0.2–1.2)
Total Protein: 6.8 g/dL (ref 6.0–8.3)

## 2021-05-06 LAB — CBC WITH DIFFERENTIAL/PLATELET
Basophils Absolute: 0.1 10*3/uL (ref 0.0–0.1)
Basophils Relative: 0.9 % (ref 0.0–3.0)
Eosinophils Absolute: 0.1 10*3/uL (ref 0.0–0.7)
Eosinophils Relative: 1.4 % (ref 0.0–5.0)
HCT: 44.2 % (ref 39.0–52.0)
Hemoglobin: 14.7 g/dL (ref 13.0–17.0)
Lymphocytes Relative: 18.4 % (ref 12.0–46.0)
Lymphs Abs: 1 10*3/uL (ref 0.7–4.0)
MCHC: 33.3 g/dL (ref 30.0–36.0)
MCV: 94.1 fl (ref 78.0–100.0)
Monocytes Absolute: 0.5 10*3/uL (ref 0.1–1.0)
Monocytes Relative: 8.9 % (ref 3.0–12.0)
Neutro Abs: 4 10*3/uL (ref 1.4–7.7)
Neutrophils Relative %: 70.4 % (ref 43.0–77.0)
Platelets: 183 10*3/uL (ref 150.0–400.0)
RBC: 4.7 Mil/uL (ref 4.22–5.81)
RDW: 14 % (ref 11.5–15.5)
WBC: 5.7 10*3/uL (ref 4.0–10.5)

## 2021-05-06 LAB — IBC + FERRITIN
Ferritin: 70.5 ng/mL (ref 22.0–322.0)
Iron: 71 ug/dL (ref 42–165)
Saturation Ratios: 20.9 % (ref 20.0–50.0)
TIBC: 340.2 ug/dL (ref 250.0–450.0)
Transferrin: 243 mg/dL (ref 212.0–360.0)

## 2021-05-06 NOTE — Progress Notes (Signed)
? ?Chief Complaint: Positive Hemoccult ? ?HPI: ?   Jon Leonard is a 66 year old male with a past medical history as listed below, known to Dr. Ardis Hughs, who was referred to me by Burnard Bunting, MD for a complaint of positive Hemoccult testing. ? ?EGD September 2001, Dr. Teena Irani at [facility] gastroenterology, indications "epigastric abdominal pain, a several day history of black stools, with heme positive stools confirmed on exam today".  Findings duodenal ulcer with duodenitis, gastritis, moderately severe esophagitis.  CLOtest was positive. ?  ?Colonoscopy July 2010, Dr. Teena Irani at [facility] gastroenterology.  Indication family history of colon cancer, personal history of colon polyps.  Findings 5 mm polyp in the descending colon was removed.  This was an adenomatous polyp on pathology.  Also left sided diverticulosis. ?  ?Colonoscopy December 2014, Dr. Teena Irani at [facility] gastroenterology, indications "heme positive stool, personal history of colon polyps".  Findings; 4 subcentimeter polyps were removed throughout the colon.    These were all tubular adenomas. ?  ?   11/07/2019 colonoscopy for personal history of colon polyps with 3 to-3 mm polyps in the descending colon and cecum, one 11 mm polyp in the sigmoid colon, diverticulosis in the left colon and otherwise normal.  Pathology showed tubular adenomas and repeat was recommended in 3 years. ? ?   Today, the patient presents to clinic and tells me that he had his regular annual visit with his PCP back in December and was found to have occult positive stool.  He forgot to follow-up on it until now.  Tells me he has maybe noticed a little "tiredness", but describes that his sleep is disturbed most nights.  He tells me his biggest concern is that he possibly has another ulcer.  Tells me he has continued the Omeprazole 20 mg daily and really has no symptoms of heartburn reflux or epigastric pain as long as he remembers to take this.  Often he will experience some  discomfort the night after drinking.  Tells me he goes out about 2 nights a week and will have up to 4 beers or sometimes vodka and red bull and often when he wakes up in the morning after that he will have some epigastric discomfort which goes away within a couple of days.  He has not noticed any bright red blood or black tarry sticky stools.  Does tell me he has had occasional episodes of diarrhea here and there which again seem related to his alcohol intake.  Also tells me he was on Tamsulosin and started back in December which did give him some dizziness and diarrhea.  He stopped this about 1 to 2 months ago and has had no further diarrhea like that.  Overall he remains very stable. ?   Denies fever, chills, weight loss, abdominal pain or symptoms that awaken him from sleep. ? ?Past Medical History:  ?Diagnosis Date  ? Blood in stool   ? Diverticulosis   ? Duodenitis   ? Esophagitis   ? Gastritis   ? GERD (gastroesophageal reflux disease)   ? Hemorrhoids   ? Thyroid disease   ? Tubular adenoma   ? ? ?Past Surgical History:  ?Procedure Laterality Date  ? COLONOSCOPY    ? HERNIA REPAIR    ? TONSILECTOMY, ADENOIDECTOMY, BILATERAL MYRINGOTOMY AND TUBES    ? UPPER GASTROINTESTINAL ENDOSCOPY    ? ? ?Current Outpatient Medications  ?Medication Sig Dispense Refill  ? calcium carbonate (TUMS - DOSED IN MG ELEMENTAL CALCIUM) 500  MG chewable tablet Chew 1 tablet by mouth daily.    ? levothyroxine (SYNTHROID, LEVOTHROID) 25 MCG tablet Take 25 mcg by mouth daily before breakfast.    ? omeprazole (PRILOSEC) 20 MG capsule Take 20 mg by mouth daily.    ? ?Current Facility-Administered Medications  ?Medication Dose Route Frequency Provider Last Rate Last Admin  ? 0.9 %  sodium chloride infusion  500 mL Intravenous Once Milus Banister, MD      ? ? ?Allergies as of 05/06/2021 - Review Complete 11/07/2019  ?Allergen Reaction Noted  ? Penicillins  01/22/2013  ? ? ?Family History  ?Problem Relation Age of Onset  ? Colon cancer  Father   ? Diabetes Sister   ? Esophageal cancer Neg Hx   ? Rectal cancer Neg Hx   ? ? ?Social History  ? ?Socioeconomic History  ? Marital status: Single  ?  Spouse name: Not on file  ? Number of children: Not on file  ? Years of education: Not on file  ? Highest education level: Not on file  ?Occupational History  ? Not on file  ?Tobacco Use  ? Smoking status: Never  ? Smokeless tobacco: Never  ?Vaping Use  ? Vaping Use: Never used  ?Substance and Sexual Activity  ? Alcohol use: Yes  ?  Comment: occ  ? Drug use: No  ? Sexual activity: Not on file  ?Other Topics Concern  ? Not on file  ?Social History Narrative  ? Not on file  ? ?Social Determinants of Health  ? ?Financial Resource Strain: Not on file  ?Food Insecurity: Not on file  ?Transportation Needs: Not on file  ?Physical Activity: Not on file  ?Stress: Not on file  ?Social Connections: Not on file  ?Intimate Partner Violence: Not on file  ? ? ?Review of Systems:    ?Constitutional: No weight loss, fever or chills ?Skin: No rash  ?Cardiovascular: No chest pain  ?Respiratory: No SOB  ?Gastrointestinal: See HPI and otherwise negative ?Genitourinary: No dysuria  ?Neurological: No headache, dizziness or syncope ?Musculoskeletal: No new muscle or joint pain ?Hematologic: No bruising ?Psychiatric: No history of depression or anxiety ? ? Physical Exam:  ?Vital signs: ?BP 114/62   Pulse 67   Ht '5\' 9"'$  (1.753 m)   Wt 147 lb 9.6 oz (67 kg)   SpO2 98%   BMI 21.80 kg/m?   ? ?Constitutional:   Pleasant Caucasian male appears to be in NAD, Well developed, Well nourished, alert and cooperative ?Head:  Normocephalic and atraumatic. ?Eyes:   PEERL, EOMI. No icterus. Conjunctiva pink. ?Ears:  Normal auditory acuity. ?Neck:  Supple ?Throat: Oral cavity and pharynx without inflammation, swelling or lesion.  ?Respiratory: Respirations even and unlabored. Lungs clear to auscultation bilaterally.   No wheezes, crackles, or rhonchi.  ?Cardiovascular: Normal S1, S2. No MRG.  Regular rate and rhythm. No peripheral edema, cyanosis or pallor.  ?Gastrointestinal:  Soft, nondistended, nontender. No rebound or guarding. Normal bowel sounds. No appreciable masses or hepatomegaly. ?Rectal:  Not performed.  ?Msk:  Symmetrical without gross deformities. Without edema, no deformity or joint abnormality.  ?Neurologic:  Alert and  oriented x4;  grossly normal neurologically.  ?Skin:   Dry and intact without significant lesions or rashes. ?Psychiatric: Demonstrates good judgement and reason without abnormal affect or behaviors. ? ?Requesting recent labs. ? ?Assessment: ?1.  Positive Hemoccult testing: Apparently by his PCP back in December, he has not noticed any bleeding though does discuss some fatigue today; consider hemorrhoids  versus other ?2.  History of PUD: Previous history of bleeding ulcers at least 15 years ago, no signs of this now though patient is worried, remains on Omeprazole 20 mg daily ?3.  History of adenomatous polyps: Last colonoscopy in 2021 with recommendations to repeat in 3 years, will be due in October of next year for surveillance ? ?Plan: ?1.  We will repeat labs today including CBC, CMP and iron studies.  If these show any decline in blood count or signs of iron deficiency then will need to rediscuss EGD and colonoscopy.  Otherwise patient is advised to stay on his Omeprazole 20 mg daily and continue to watch for any other symptoms including bright red blood in his stool, black tarry sticky stools, syncope, abdominal pain, change in bowel habits or other. ?2.  Requesting recent labs from PCP. ?3.  Patient to follow in clinic with [place] per recommendations after above. ? ?Ellouise Newer, PA-C ?[facility] ?05/06/2021, 3:27 PM ? ?Cc: Burnard Bunting, MD  ?

## 2021-05-06 NOTE — Patient Instructions (Signed)
Your provider has requested that you go to the basement level for lab work before leaving today. Press "B" on the elevator. The lab is located at the first door on the left as you exit the elevator.  ? ?Follow up per recommendations after labs ? ?If you are age 67 or older, your body mass index should be between 23-30. Your Body mass index is 21.8 kg/m?Marland Kitchen If this is out of the aforementioned range listed, please consider follow up with your Primary Care Provider. ? ?If you are age 31 or younger, your body mass index should be between 19-25. Your Body mass index is 21.8 kg/m?Marland Kitchen If this is out of the aformentioned range listed, please consider follow up with your Primary Care Provider.  ? ?________________________________________________________ ? ?The [facility] providers would like to encourage you to use [facility] to communicate with providers for non-urgent requests or questions.  Due to long hold times on the telephone, sending your provider a message by [facility] may be a faster and more efficient way to get a response.  Please allow 48 business hours for a response.  Please remember that this is for non-urgent requests.  ?_______________________________________________________  ? ?Due to recent changes in healthcare laws, you may see the results of your imaging and laboratory studies on MyChart before your provider has had a chance to review them.  We understand that in some cases there may be results that are confusing or concerning to you. Not all laboratory results come back in the same time frame and the provider may be waiting for multiple results in order to interpret others.  Please give [place] 48 hours in order for your provider to thoroughly review all the results before contacting the office for clarification of your results.   ? ?I appreciate the  opportunity to care for you ? ?Thank You  ? ?Lovett Calender ?

## 2021-05-11 NOTE — Progress Notes (Signed)
He is being overscreened for colon cancer by his PCP.  I do not think he needs to have a colonoscopy currently.   ? ?I recommend he discuss this further with his PCP.   ? ? ?For now, should stay on for polyp surveillance colonoscopy in 2024. ?

## 2021-07-16 DIAGNOSIS — R5383 Other fatigue: Secondary | ICD-10-CM | POA: Diagnosis not present

## 2021-07-16 DIAGNOSIS — R1010 Upper abdominal pain, unspecified: Secondary | ICD-10-CM | POA: Diagnosis not present

## 2021-07-16 DIAGNOSIS — E039 Hypothyroidism, unspecified: Secondary | ICD-10-CM | POA: Diagnosis not present

## 2021-07-16 DIAGNOSIS — K219 Gastro-esophageal reflux disease without esophagitis: Secondary | ICD-10-CM | POA: Diagnosis not present

## 2021-08-06 DIAGNOSIS — H6121 Impacted cerumen, right ear: Secondary | ICD-10-CM | POA: Diagnosis not present

## 2021-09-07 DIAGNOSIS — H906 Mixed conductive and sensorineural hearing loss, bilateral: Secondary | ICD-10-CM | POA: Diagnosis not present

## 2021-11-09 DIAGNOSIS — H524 Presbyopia: Secondary | ICD-10-CM | POA: Diagnosis not present

## 2021-11-11 DIAGNOSIS — E039 Hypothyroidism, unspecified: Secondary | ICD-10-CM | POA: Diagnosis not present

## 2021-11-11 DIAGNOSIS — E785 Hyperlipidemia, unspecified: Secondary | ICD-10-CM | POA: Diagnosis not present

## 2021-11-11 DIAGNOSIS — R5383 Other fatigue: Secondary | ICD-10-CM | POA: Diagnosis not present

## 2021-11-11 DIAGNOSIS — Z125 Encounter for screening for malignant neoplasm of prostate: Secondary | ICD-10-CM | POA: Diagnosis not present

## 2021-11-18 DIAGNOSIS — M25511 Pain in right shoulder: Secondary | ICD-10-CM | POA: Diagnosis not present

## 2021-11-18 DIAGNOSIS — E785 Hyperlipidemia, unspecified: Secondary | ICD-10-CM | POA: Diagnosis not present

## 2021-11-18 DIAGNOSIS — R82998 Other abnormal findings in urine: Secondary | ICD-10-CM | POA: Diagnosis not present

## 2021-11-18 DIAGNOSIS — Z0000 Encounter for general adult medical examination without abnormal findings: Secondary | ICD-10-CM | POA: Diagnosis not present

## 2021-11-18 DIAGNOSIS — N401 Enlarged prostate with lower urinary tract symptoms: Secondary | ICD-10-CM | POA: Diagnosis not present

## 2021-12-08 DIAGNOSIS — H906 Mixed conductive and sensorineural hearing loss, bilateral: Secondary | ICD-10-CM | POA: Diagnosis not present

## 2022-01-14 ENCOUNTER — Encounter: Attending: Nurse Practitioner

## 2022-01-27 DIAGNOSIS — K080 Exfoliation of teeth due to systemic causes: Secondary | ICD-10-CM | POA: Diagnosis not present

## 2022-02-25 DIAGNOSIS — Z85828 Personal history of other malignant neoplasm of skin: Secondary | ICD-10-CM | POA: Diagnosis not present

## 2022-02-25 DIAGNOSIS — D225 Melanocytic nevi of trunk: Secondary | ICD-10-CM | POA: Diagnosis not present

## 2022-02-25 DIAGNOSIS — C4441 Basal cell carcinoma of skin of scalp and neck: Secondary | ICD-10-CM | POA: Diagnosis not present

## 2022-02-25 DIAGNOSIS — L570 Actinic keratosis: Secondary | ICD-10-CM | POA: Diagnosis not present

## 2022-02-25 DIAGNOSIS — L821 Other seborrheic keratosis: Secondary | ICD-10-CM | POA: Diagnosis not present

## 2022-02-25 DIAGNOSIS — L111 Transient acantholytic dermatosis [Grover]: Secondary | ICD-10-CM | POA: Diagnosis not present

## 2022-02-25 DIAGNOSIS — C44319 Basal cell carcinoma of skin of other parts of face: Secondary | ICD-10-CM | POA: Diagnosis not present

## 2022-03-10 DIAGNOSIS — H906 Mixed conductive and sensorineural hearing loss, bilateral: Secondary | ICD-10-CM | POA: Diagnosis not present

## 2022-03-10 DIAGNOSIS — H6123 Impacted cerumen, bilateral: Secondary | ICD-10-CM | POA: Diagnosis not present

## 2022-03-15 NOTE — Telephone Encounter (Signed)
Formatting of this note might be different from the original.  Patient called stating he is on Nefidipine he wanted to make sure he could take the Losartan with that medication? I informed him that I'm sure he can but would check with you anyway.  Electronically signed by Alleen Borne at 03/15/2022 11:29 AM EST

## 2022-03-19 NOTE — Telephone Encounter (Signed)
Formatting of this note might be different from the original.  Spoke with patient and informed him of all info.  Electronically signed by Alleen Borne at 03/19/2022  8:43 AM EST

## 2022-03-29 ENCOUNTER — Ambulatory Visit: Admit: 2022-03-29 | Payer: MEDICARE | Attending: Physician Assistant | Primary: Family

## 2022-03-29 DIAGNOSIS — C61 Malignant neoplasm of prostate: Secondary | ICD-10-CM

## 2022-03-29 LAB — AMB POC URINALYSIS DIP STICK AUTO W/O MICRO
Blood, Urine, POC: NEGATIVE
Glucose, Urine, POC: NEGATIVE
Leukocyte Esterase, Urine, POC: NEGATIVE
Nitrite, Urine, POC: NEGATIVE
Protein, Urine, POC: NEGATIVE
Specific Gravity, Urine, POC: 1.015 (ref 1.001–1.035)
Urobilinogen, POC: 0.2
pH, Urine, POC: 6 (ref 4.6–8.0)

## 2022-03-29 NOTE — Progress Notes (Signed)
Jeremy Green  [birth year]        Dr. Shelda Altes is the supervising physician for this day.       Diagnosis Orders   1. Prostate cancer ([place])              ASSESSMENT:   UA today: negative      1. Prostate Cancer- unknown grade   Prostatectomy in 2007, no ADT or XRT that patient aware of   Detectable PSA of 0.05 in [place], PET scan was negative   PET scan 05/22/21- no abnormal hypermetabolic activity to suggest malignancy, of note prostate cancers are often not FDG avid   Last PSA 03/19/21- 1.5        09/11/20- 1.3        12/17/19- 1.1         10/05/18- 0.9   DRE 03/29/22- no palpable prostate    2. Left Kidney Stones (no size or quantity) noted on PET 05/22/2021    3. 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 Shelda Altes, recheck PSA and plan for PSMA for biochemical recurrence of CaP      PLAN:    Reviewed PCP notes  Reviewed PET scan  Reviewed PSA trend  PSA drawn today  RTC pending PSA         DISCUSSION:    No chief complaint on file.      HISTORY OF PRESENT ILLNESS:  Jeremy Green is a 67 y.o. male who is seen in consultation as referred by Dr. No primary care provider on file. for hx of prostate cancer. Prostatectomy in 2007. Notes biochemical recurrence but PET scan May 2023 in [place] which was negative. No other treatments.     Patient denies issues with urination, good FOS, no sense of PVR.     Denies flank pain, gross hematuria, dysuria, asymptomatic for infection. No f/c/n/v.     No family history of prostate, bladder or kidney cancer  Former smoker: quit in 1979, smoked for 5-10 years, 0.5 PPD  No industrial chemical or radiation exposure        REVIEW OF LABS & IMAGING:  Lab Results   Component Value Date/Time    PSA 1.5 03/19/2021 12:00 AM    PSA 1.3 09/11/2020 12:00 AM    PSA 1.1 12/17/2019 12:00 AM                No data to display                  Past Medical History:   Diagnosis Date    GERD (gastroesophageal reflux disease)     HLD (hyperlipidemia)     HTN (hypertension)     Prostate  cancer [facility]) 2007    prostatectomy; [place]       Past Surgical History:   Procedure Laterality Date    UROLOGICAL SURGERY  2007    prostatectomy            Not on File    No family history on file.    No current outpatient medications on file.     No current facility-administered medications for this visit.           PHYSICAL EXAMINATION:   There were no vitals taken for this visit.  Constitutional: WDWN, Pleasant and appropriate affect, No acute distress.    CV:  No peripheral swelling noted  Respiratory: No respiratory distress or difficulties  Abdomen:  No abdominal  distension  GU Male:  03/29/22  [id number] normal to visual inspection, no erythema or irritation, Sphincter with good tone, Rectum with no hemorrhoids, fissures or masses, no palpable prostate  Skin: No jaundice.    Neuro/Psych:  Alert and oriented x 3, affect appropriate.   MSK: FROM      LABS TODAY:    No results found for this or any previous visit (from the past 12 hour(s)).     A copy of today's office visit with all pertinent imaging results and labs were sent to the referring physician.        Georgia Duff, PA-C  [facility], [address]  [place] [address]  P: [phone]   F: [phone]

## 2022-03-31 LAB — PROSTATE SPECIFIC ANTIGEN, TOTAL: PSA: 1.91 ng/mL (ref 0.00–4.00)

## 2022-03-31 NOTE — Other (Signed)
PSA still elevating. Recommend PSMA for biochemical recurrence of prostate cancer and RTC with DR. Unnikrishnan next available after PSMA

## 2022-04-01 ENCOUNTER — Telehealth

## 2022-04-01 NOTE — Telephone Encounter (Signed)
-----   Message from Georgia Duff, [place] sent at 03/31/2022  3:04 PM EDT -----  PSA still elevating. Recommend PSMA for biochemical recurrence of prostate cancer and RTC with DR. Unnikrishnan next available after PSMA

## 2022-04-01 NOTE — Telephone Encounter (Signed)
I spoke with pt and he was aware thanked me and call was ended.    Jeremy Green has an order for PSMA    ALL MALE PATIENTS NEEDING AN MRI OF PELVIS OR PROSTATE, MUST BE SCHEDULED AT ONE OF THE FOLLOWING:    **MRI/CT (Preferred [facility])  **[facility] [place] (Preferred [facility])  **[facility] (Preferred [place])  [facility]  [place]      To be done at [place] by:  NOW    Patient has a follow-up appointment:  YES  05-20-22    If MRI, does patient have a pacemaker:   NO    Order has been placed in connect care:  YES    Is this a STAT order:  [place], MA

## 2022-04-05 NOTE — Telephone Encounter (Signed)
Wednesday, April 07, 2022 12:45 PM f [facility], [facility], PSMA, 14min  cancel or reschedule  psma/ patient to arrive 15 mins early.  Edit  APPROVED # [id number] 04/05/2022-10/02/2022  Edit  Add note

## 2022-04-05 NOTE — Telephone Encounter (Signed)
PATIENT Jeremy Green, Jeremy Green [birth year]  [id number] )  [facility] TAX ID : [id number] [facility] NPI: [id number]  Georgia Duff, PA  NPI:  [id number]  CPT CODES: [id number] 5 UNITS   DX CODES: [id number]  NOTES:APPROVED # [id number]   04/05/2022-10/02/2022  CASE # [id number]

## 2022-04-07 ENCOUNTER — Ambulatory Visit: Admit: 2022-04-07 | Discharge: 2022-04-07 | Payer: MEDICARE | Primary: Family

## 2022-04-07 DIAGNOSIS — C61 Malignant neoplasm of prostate: Secondary | ICD-10-CM

## 2022-04-07 MED ORDER — GALLIUM GA 68 GOZETOTIDE 25 MCG IV KIT
25 | Freq: Once | INTRAVENOUS | 0 refills | Status: AC
Start: 2022-04-07 — End: 2022-04-07

## 2022-04-07 NOTE — Progress Notes (Signed)
Per orders of PA.Jeremy Green was injected with 4.60mCi Ga-68 Illuccix via IV (LACF)  Injection was given at 1256    Patient tolerated well.   Dr.Khori Rosevear was available in the clinic as incident to provider.     Patient's weight was 170 lbs    Prescribed Dose(dose ordered):   5.57mCi at 1245                                                                                         Pre-Measured Dose(dose received):    5.13mCi at 1245  Pre-injection Assay:  5.71mCi at 1255  Administered Dose(dose given):   4.85mCi at 1256  Post-Measured Dose(residual/waste):  0.50mCi at 1257                                                                                               Ashlee Burton,CNMT     Reviewed by Lurlean Nanny, MD

## 2022-04-07 NOTE — Progress Notes (Signed)
IV infusion started in LACF with existing 23-gauge IV catheter. IV site free from pain, redness, or signs of infiltration. IV site flushed readily IV with normal saline. Done By: Shay Deb Loudin, MA

## 2022-04-12 ENCOUNTER — Encounter

## 2022-04-13 NOTE — Telephone Encounter (Signed)
-----   Message from Georgia Duff, [place] sent at 04/12/2022 11:36 AM EDT -----  PET scan is negative. Follow up as scheduled with Dr. Shelda Altes.     ----- Message -----  From: Lowella Bandy, [place]  Sent: 04/12/2022   8:22 AM EDT  To: Georgia Duff, PA

## 2022-04-13 NOTE — Telephone Encounter (Signed)
Lvm for pt to call office back.    Please inform pt of what Pa Aloia stated.    Thanks

## 2022-05-20 ENCOUNTER — Encounter: Admit: 2022-05-20 | Payer: MEDICARE | Attending: Urology | Primary: Family

## 2022-05-20 DIAGNOSIS — C772 Secondary and unspecified malignant neoplasm of intra-abdominal lymph nodes: Secondary | ICD-10-CM

## 2022-05-20 NOTE — Progress Notes (Unsigned)
Jeremy Green  [birth year]          No chief complaint on file.      ICD-10-CM    1. 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 Prostate Cancer- unknown grade   Prostatectomy in 2007, no ADT or XRT that patient aware of   Detectable PSA of 0.05 in [place], PET scan was negative   PET scan 05/22/21- no abnormal hypermetabolic activity to suggest malignancy, of note prostate cancers are often not FDG avid   DRE 03/29/22- no palpable prostate   PSMA PET/CT 04/07/2022: Prostatectomy without evidence of tracer avid local recurrent neoplasm. A 7 mm mildly tracer avid preaortic lymph node with max SUV 3.0. Tracer avid 6 mm precaval lymph node with max SUV 3.4. Tracer avid 1.4 cm upper aortocaval lymph node with max SUV 4.5. These are concerning for tracer avid nodal metastases.   Most recent PSA 03/29/2022 - 1.91 ng/mL.     2. Left Kidney Stones (no size or quantity) noted on PET 05/22/2021    3. 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 Doreene Burke, recheck PSA and plan for PSMA for biochemical recurrence of CaP    Reviewed PCP notes  Reviewed PET scan  Reviewed PSA trend  PSA drawn today  RTC pending PSA    HISTORY OF PRESENT ILLNESS:  Jeremy Green is a 67 y.o. male who is following up today for prostate cancer with rising PSA a condition that will require long term care. Prostatectomy in 2007. Notes biochemical recurrence but PET scan May 2023 in [place] which was negative. No other treatments.     Interval history:   ***          No family history of prostate, bladder or kidney cancer  Former smoker: quit in 1979, smoked for 5-10 years, 0.5 PPD  No industrial chemical or radiation exposure        LABS AND IMAGING:      PSA Trend  Lab Results   Component Value Date    PSA 1.91 03/29/2022    PSA 1.5 03/19/2021    PSA 1.3 09/11/2020    PSA 1.1 12/17/2019    PSA 1.1 09/03/2019    PSA 0.9 10/05/2018        No results  found for this visit on 05/20/22.    PSMA PET/CT 04/07/2022  CONCLUSION:  1. Prostatectomy without evidence of tracer avid local recurrent neoplasm.  2. Tracer avid upper retroperitoneal lymph nodes, concerning for nodal metastases.  3. Otherwise, no tracer avid distant metastatic disease.    Most recent imaging:  No image results found.        Past Medical History:   Diagnosis Date    GERD (gastroesophageal reflux disease)     HLD (hyperlipidemia)     HTN (hypertension)     Prostate cancer [facility]) 2007    prostatectomy; [place]       Past Surgical History:   Procedure Laterality Date    UROLOGICAL SURGERY  2007    prostatectomy            No Known Allergies    No family history on file.    Current Outpatient Medications   Medication Sig Dispense Refill  NIFEdipine (PROCARDIA XL) 60 MG extended release tablet Take 1 tablet by mouth daily      LOSARTAN POTASSIUM PO Take by mouth      hydroCHLOROthiazide (HYDRODIURIL) 50 MG tablet Take 1 tablet by mouth daily      pantoprazole (PROTONIX) 40 MG injection Infuse 40 mg intravenously daily      ezetimibe (ZETIA) 10 MG tablet Take 1 tablet by mouth daily      tiZANidine (ZANAFLEX) 2 MG tablet Take 1 tablet by mouth every 6 hours as needed       No current facility-administered medications for this visit.             PHYSICAL EXAMINATION:   There were no vitals taken for this visit.  Constitutional: WDWN, Pleasant and appropriate affect, No acute distress.    Respiratory: No respiratory distress or difficulties  Skin: No jaundice.    Neuro/Psych:  Alert and oriented x 3, affect appropriate.       Patient's BMI is out of the normal parameters.  Information about BMI was given and patient was advised to follow-up with their PCP for further management.        A copy of today's office visit with all pertinent imaging results and labs were sent to the referring physician.        Karie Fetch, M.D.  [facility]   [address]   [place], [place] [address]    Phone: [phone]    Fax: [phone]    [address]  [place], [place] [address]  P: [phone]   F: [phone]        Medical documentation is provided with the assistance of Marcene Brawn, medical scribe for Jetta Lout on 05/20/2022

## 2022-05-21 NOTE — Telephone Encounter (Signed)
-----   Message from Raman Unnikrishnan, MD sent at 05/20/2022  9:53 AM EDT -----  New start recurrent mHSPC  [address]  Xtandi

## 2022-05-21 NOTE — Telephone Encounter (Addendum)
Auth submitted and approved via CMM. T/C RX will be pended to MD when he's next in office.    Lona Millard, LPN

## 2022-05-21 NOTE — Telephone Encounter (Deleted)
-----   Message from Karie Fetch, MD sent at 05/20/2022  9:53 AM EDT -----  New start recurrent mHSPC  [address]  Xtandi

## 2022-05-21 NOTE — Telephone Encounter (Signed)
Per orders of Dr. Doreene Burke.  I have referred Jeremy Green to the Benefits Dept to begin benefit verification for [name]   (name of medication).       05/21/2022   Dosage:  42mg   Frequency: 1   Appt Date: 05/31/2022  Dx Code:  C61  Patient's ECOG Performance Status:  0  Cancer Staging:  [id number]   Requested medication has been checked for any interactions with current medications: No

## 2022-05-21 NOTE — Telephone Encounter (Signed)
Per orders of Dr. Doreene Burke.  I have referred Jeremy Green to the Benefits Dept to begin benefit verification for New start Degarelix (name of medication).       05/21/2022   Dosage:  240mg   Frequency: 1   Appt Date: 05/31/2022  Dx Code:  C61  Patient's ECOG Performance Status:  0  Cancer Staging:  pTxN2M1a   Requested medication has been checked for any interactions with current medications: No

## 2022-05-21 NOTE — Telephone Encounter (Signed)
Per orders of Dr. Doreene Burke I have referred Jeremy Green to the Benefits Dept to begin benefit verification for [facility]   (name of medication).       05/21/2022   Dosage:  160MG  DAILY   Dx Code:  C61  Patient's ECOG Performance Status:  0  Requested medication has been checked for any interactions with current medications.    Patient has not reviewed and signed the appropriate paperwork for Xtandi.     Lona Millard, LPN

## 2022-05-24 MED ORDER — ENZALUTAMIDE 40 MG PO CAPS
40 MG | ORAL_CAPSULE | ORAL | 5 refills | Status: AC
Start: 2022-05-24 — End: 2022-06-08

## 2022-05-24 NOTE — Telephone Encounter (Signed)
Test claim for IOD. Auth and signed script have been scanned into media.    Signed prescription is in the dispensary relay folder outside [facility].    Alizea Pell, LPN

## 2022-05-24 NOTE — Addendum Note (Signed)
Addended by: Lona Millard on: 05/24/2022 08:56 AM     Modules accepted: Orders

## 2022-05-25 NOTE — Telephone Encounter (Signed)
Pt advised they would like RX sent to mail order as they live in [place] - RX will be sent out once we receive signed paperwork back, mailed out today.    Lona Millard, LPN

## 2022-05-25 NOTE — Telephone Encounter (Signed)
Per TC, pt co-pay is $0.00. We can fill on 06/09/22 when Dr. Bishop Limbo is in the office. He will also be here on 05/28/22 but neither Selena Batten or myself will be working on 05/28/22.

## 2022-05-25 NOTE — Telephone Encounter (Signed)
Pt has been conditionally approved for the TAF grant that's currently open. Pt will receive a letter in the mail within 5-7 days with instructions to complete enrollment.        Lona Millard, LPN

## 2022-05-27 NOTE — Telephone Encounter (Signed)
Pt would like to be referred to an in-network provider. I advised pt to contact his Ins for INN provider

## 2022-05-27 NOTE — Telephone Encounter (Signed)
05/26/2022 [id number] NOTE Bv request-degarelix 240mg -dos 5/20-per chat w/aetna rep Thurston Hole ref #[id number] pt is covered @100 %-buy & bill is allowed & valid d& billable-no restrictions. She states Dr. Bishop Limbo is out of network w/20% co-ins(no deductible)-office is in network & covered @100 %. Spoke w/auth dept Windell Moulding ref [phone] who states its handled by the speciality pharmacy. I spoke w/Duyniesha G. who states they do cover all codes [phone], [phone] & [id number]-request can be submitted through availity.      05/27/2022 [email or web address] NOTE auth approved from [id number] valid 05/26/22-05/26/23-pt has a 20% co-ins of $215.74-pt advised via phone & states he has never owed w/his Ins-I advised of Dr. being OON w/plan. Pt would like to be referred to an INN DR-mess to nurse

## 2022-05-28 NOTE — Telephone Encounter (Signed)
As patient was previously advised for further assistance regarding INN provider patient would need to contact insurance plan.     Armstead Peaks, MA

## 2022-05-31 ENCOUNTER — Encounter: Admit: 2022-05-31 | Discharge: 2022-06-01 | Payer: MEDICARE | Primary: Family

## 2022-05-31 DIAGNOSIS — C772 Secondary and unspecified malignant neoplasm of intra-abdominal lymph nodes: Secondary | ICD-10-CM

## 2022-05-31 MED ORDER — DEGARELIX ACETATE(240 MG DOSE) 120 MG/VIAL SC SOLR
120 MG/VIAL | Freq: Once | SUBCUTANEOUS | 0 refills | Status: AC
Start: 2022-05-31 — End: 2022-05-31

## 2022-05-31 NOTE — Progress Notes (Unsigned)
Jeremy Green is a 67 y.o. male who is here today per the order of Dr. Doreene Burke to receive Deborra Medina 240mg  and to have labs drawn.   Patient's identity has been verified.   Dr. Doreene Burke was available in the clinic as incident to provider.     PSA, TST, CMP, Vitamin D, and CBC  obtained via venipuncture without any difficulty.  Patient will be notified with lab results.    Firmagon 240mg  is administered to upper arm ( bilateral ) SQ without difficulty.     Patient tolerated the injection well.    Patient has been scheduled for the next injection which will be due in approximately 4 weeks  Patient has a follow-up appointment with provider on September 22, 2022      Encounter Diagnoses   Name Primary?    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    Prostate cancer (HCC)     Vitamin D deficiency        Orders Placed This Encounter   Procedures    Testosterone    CBC    Comprehensive Metabolic Panel    VITAMIN D 25 HYDROXY    PR COLLECTION VENOUS BLOOD VENIPUNCTURE    CHEMOTHER HORMON ANTINEOPL SUB-Q/IM (QUANTITY - 2)    PR DEGARELIX INJECTION     Order Specific Question:   Dose     Answer:   240mg      Order Specific Question:   Site     Answer:   LEFT ARM     Order Specific Question:   Expiration Date     Answer:   08/10/2024     Order Specific Question:   Lot#     Answer:   [id number]     Order Specific Question:   Manufacturer     Answer:   Guss Bunde     Order Specific Question:   Charge Quantity?     Answer:   4     Order Specific Question:   Perfomed by/Witnessed by:     Answer:   th     Order Specific Question:   NDC#     Answer:   [address]-4 [[id number]]       Patient did not supply own medication.      Inis Sizer, [place]       ***

## 2022-06-01 LAB — CBC
Hematocrit: 53.4 % — ABNORMAL HIGH (ref 37.5–51.0)
Hemoglobin: 17.6 g/dL (ref 13.0–17.7)
MCH: 28.3 pg (ref 26.6–33.0)
MCHC: 33 g/dL (ref 31.5–35.7)
MCV: 86 fL (ref 79–97)
Platelets: 265 10*3/uL (ref 150–450)
RBC: 6.21 x10E6/uL — ABNORMAL HIGH (ref 4.14–5.80)
RDW: 13.4 % (ref 11.6–15.4)
WBC: 7.5 10*3/uL (ref 3.4–10.8)

## 2022-06-02 LAB — COMPREHENSIVE METABOLIC PANEL
ALT: 58 IU/L — ABNORMAL HIGH (ref 0–44)
AST: 52 IU/L — ABNORMAL HIGH (ref 0–40)
Albumin/Globulin Ratio: 1.5 (ref 1.2–2.2)
Albumin: 4.5 g/dL (ref 3.9–4.9)
Alkaline Phosphatase: 87 IU/L (ref 44–121)
BUN/Creatinine Ratio: 13 (ref 10–24)
BUN: 16 mg/dL (ref 8–27)
CO2: 23 mmol/L (ref 20–29)
Calcium: 10 mg/dL (ref 8.6–10.2)
Chloride: 99 mmol/L (ref 96–106)
Creatinine: 1.2 mg/dL (ref 0.76–1.27)
Est, Glom Filt Rate: 66 mL/min/{1.73_m2} (ref >59)
Globulin, Total: 3 g/dL (ref 1.5–4.5)
Glucose: 83 mg/dL (ref 70–99)
Potassium: 3.7 mmol/L (ref 3.5–5.2)
Sodium: 141 mmol/L (ref 134–144)
Total Bilirubin: 0.6 mg/dL (ref 0.0–1.2)
Total Protein: 7.5 g/dL (ref 6.0–8.5)

## 2022-06-02 LAB — TESTOSTERONE: Testosterone: 1124 ng/dL — ABNORMAL HIGH (ref 264–916)

## 2022-06-02 LAB — VITAMIN D 25 HYDROXY: Vit D, 25-Hydroxy: 40.8 ng/mL (ref 30.0–100.0)

## 2022-06-03 NOTE — Other (Signed)
Labs are ok. Mild AST/ALT elevation. Will monitor.

## 2022-06-08 DIAGNOSIS — H6121 Impacted cerumen, right ear: Secondary | ICD-10-CM | POA: Diagnosis not present

## 2022-06-08 MED ORDER — ENZALUTAMIDE 40 MG PO CAPS
40 MG | ORAL_CAPSULE | ORAL | 5 refills | Status: DC
Start: 2022-06-08 — End: 2022-11-23

## 2022-06-08 NOTE — Telephone Encounter (Signed)
RX sent out to [facility] per patient request w/ TAF grant information.    Lona Millard, LPN

## 2022-06-08 NOTE — Addendum Note (Signed)
Addended by: Lona Millard on: 06/08/2022 03:38 PM     Modules accepted: Orders

## 2022-06-14 NOTE — Telephone Encounter (Signed)
New start XTANDI    Start date is: 06/16/22  Receiving med from: [facility]  Med check reminder: SENT  Added to tracker: YES  Labs set up: N/A  Sticky note created: YES  Dispensing letter sent: YES  All documents scanned into media: YES      Patient was advised we will be calling weekly for 1 month after starting medication, bi-weekly for 1 month, then monthly thereafter to assess for any side effects.     All questions answered at this time. Patient has no concerns, provided with direct contact information for navigators.     Lona Millard, LPN

## 2022-06-21 NOTE — Telephone Encounter (Signed)
Med check for Oral Oncolytic:       Called pt to perform med check for oral oncolytic:  Xtandi      Pt did not answer call for med check. LVM advising him to call back if he has any questions/concerns.     Jeremy Green

## 2022-06-24 NOTE — Telephone Encounter (Signed)
camcevi-dos 6/21-auth is pending w/availity-pts 20% from [place] will be paid with active grant on file TAF

## 2022-06-25 NOTE — Telephone Encounter (Signed)
06/25/2022 [email or web address] NOTE letter received from [facility] for additional info for camcevi request-faxed form & info back

## 2022-06-28 NOTE — Telephone Encounter (Signed)
Med check for Oral Oncolytic:       Called pt to perform med check for oral oncolytic:  Xtandi      Are you taking your medication as prescribed?  yes                Have you had any change in blood pressure, swelling in extremities, or noticed a rash on any part of your body? no    Have you had any changes in ability to perform usual daily activities, changes in weight or appetite, changes in ability to sleep, increase in fatigue or pain in the last 30 days? no    [facility] Patient has contact information for specialty pharmacy.        Patient's next appointment with provider is scheduled for 09/22/22    Patient is up to date on all labs required per protocol for medication.      Patient advised to call the office if any issues arise.    Jeremy Green

## 2022-06-30 NOTE — Telephone Encounter (Signed)
06/30/2022 [email or web address] NOTE Dutch Quint [phone] 06/24/22-06/24/23

## 2022-07-02 ENCOUNTER — Encounter: Admit: 2022-07-02 | Discharge: 2022-07-05 | Payer: MEDICARE | Primary: Family

## 2022-07-02 DIAGNOSIS — C61 Malignant neoplasm of prostate: Secondary | ICD-10-CM

## 2022-07-02 MED ORDER — LEUPROLIDE MESYLATE (6 MONTH) 42 MG SC PRSY
42 | Freq: Once | SUBCUTANEOUS | 0 refills | Status: DC
Start: 2022-07-02 — End: 2022-07-02

## 2022-07-02 NOTE — Progress Notes (Signed)
Jeremy Green is a 67 y.o. male who is here today per the order of Dr. Doreene Burke to receive Camcevi and to have labs drawn.   Patient's identity has been verified.   Dr. Therisa Doyne was available in the clinic as incident to provider.     Patient has been taking supplements of at least 500 mg of calcium and 400 international units of Vitamin D daily.  This is not his first ADT injection.  Last injection:31 May 2022.    Camcevi 42mg /ml is administered to abdomen ( right) SQ without difficulty.     Patient tolerated the injection well.    Possible side effects reviewed with patient YES  Patient has been scheduled for the next injection which will be due in approximately 6 months  Patient has a follow-up appointment with provider on 22 Sep 2022      Encounter Diagnosis   Name Primary?    Prostate cancer (HCC) Yes       Orders Placed This Encounter   Procedures    PR LEUPROLIDE INJ, CAMCEVI, 1MG      Order Specific Question:   Dose     Answer:   42mg /ml     Order Specific Question:   Site     Answer:   RIGHT LOWER QUAD ABDOMEN     Order Specific Question:   Expiration Date     Answer:   09/11/2023     Order Specific Question:   Lot#     Answer:   [id number]     Order Specific Question:   Manufacturer     Answer:   [employer]ord     Dealer Question:   Charge Quantity?     Answer:   6     Order Specific Question:   Perfomed by/Witnessed by:     Answer:   chr     Order Specific Question:   NDC#     Answer:   [id number] [[id number]]    CHEMOTHER HORMON ANTINEOPL SUB-Q/IM       Patient did not supply own medication.      Lowella Bandy, MA       Collene Mares, MD    [address] [place], [place] [address]  [phone] Office  [phone] Pager

## 2022-07-05 NOTE — Telephone Encounter (Signed)
Med check for Oral Oncolytic:     Called pt to perform med check for oral oncolytic: Xtandi      Are you taking your medication as prescribed?  Yes                Have you had any change in blood pressure, swelling in extremities, or noticed a rash on any part of your body? No    Have you had any changes in ability to perform usual daily activities, changes in weight or appetite, changes in ability to sleep, increase in fatigue or pain in the last 30 days? No    [facility] Patient has contact information for specialty pharmacy.      Patient's next appointment with provider is scheduled for 09/22/22.     Patient advised to call the office if any issues arise.    Lona Millard, LPN

## 2022-07-12 MED ORDER — DEGARELIX ACETATE(240 MG DOSE) 120 MG/VIAL SC SOLR
120 | Freq: Once | SUBCUTANEOUS | 0 refills | Status: AC
Start: 2022-07-12 — End: 2024-01-17

## 2022-07-12 NOTE — Progress Notes (Signed)
Jeremy Green is a 67 y.o. male who is here today per the order of Dr. Doreene Burke to receive Jeremy Green 240mg  and to have labs drawn.   Patient's identity has been verified.   Dr. Doreene Burke was available in the clinic as incident to provider.     PSA, TST, CMP, Vitamin D, and CBC  obtained via venipuncture without any difficulty.  Patient will be notified with lab results.    Firmagon 240mg  is administered to upper arm ( bilateral ) SQ without difficulty.     Patient tolerated the injection well.    Patient has been scheduled for the next injection which will be due in approximately 4 weeks  Patient has a follow-up appointment with provider on September 22, 2022      Encounter Diagnoses   Name Primary?    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    Prostate cancer (HCC)     Vitamin D deficiency        Orders Placed This Encounter   Procedures    Testosterone    CBC    Comprehensive Metabolic Panel    VITAMIN D 25 HYDROXY    PR COLLECTION VENOUS BLOOD VENIPUNCTURE    CHEMOTHER HORMON ANTINEOPL SUB-Q/IM (QUANTITY - 2)    PR DEGARELIX INJECTION     Order Specific Question:   Dose     Answer:   240mg      Order Specific Question:   Site     Answer:   RIGHT ARM     Order Specific Question:   Expiration Date     Answer:   08/10/2024     Order Specific Question:   Lot#     Answer:   [id number]     Order Specific Question:   Manufacturer     Answer:   Guss Bunde     Order Specific Question:   Charge Quantity?     Answer:   53     Order Specific Question:   Perfomed by/Witnessed by:     Answer:   th     Order Specific Question:   NDC#     Answer:   [id number] [[id number]]       Patient did not supply own medication.      Jeremy Sizer, Jeremy Green       Reviewed.     Jeremy Fetch, MD

## 2022-07-12 NOTE — Telephone Encounter (Signed)
Med check for Oral Oncolytic:     Called pt to perform med check for oral oncolytic: Xtandi      Are you taking your medication as prescribed?  Yes                Have you had any change in blood pressure, swelling in extremities, or noticed a rash on any part of your body? No    Have you had any changes in ability to perform usual daily activities, changes in weight or appetite, changes in ability to sleep, increase in fatigue or pain in the last 30 days? No    [facility] Patient has contact information for specialty pharmacy.      Patient's next appointment with provider is scheduled for 09/22/22.     Patient advised to call the office if any issues arise.    Lona Millard, LPN

## 2022-07-12 NOTE — Addendum Note (Signed)
Addended by: Inis Sizer on: 07/12/2022 04:26 PM     Modules accepted: Orders

## 2022-07-21 NOTE — Telephone Encounter (Signed)
Patient calling in regards to needing something to help him sleep.  He would like to speak to PA Aloia .    CBN: [phone]

## 2022-07-22 NOTE — Telephone Encounter (Signed)
I spoke with pt, and informed pt that after speaking with available provider that the pt would have to speak with his PCP regarding sleep meds. He thanked me and call was ended.

## 2022-07-26 NOTE — Telephone Encounter (Signed)
Med check for Oral Oncolytic:       Called pt to perform med check for oral oncolytic:  xtandi      Are you taking your medication as prescribed?  yes                Have you had any change in blood pressure, swelling in extremities, or noticed a rash on any part of your body? no    Have you had any changes in ability to perform usual daily activities, changes in weight or appetite, changes in ability to sleep, increase in fatigue or pain in the last 30 days? Pt complaining of join/ nerve pain. Pt stated its an irritating pain in him knees, ankles and wrist. He said sometimes it causes his knees to jump and he has to walk around to relieve the irritation     [facility] Patient has contact information for specialty pharmacy.         Patient's next appointment with provider is scheduled for 09/10/22    Patient is up to date on all labs required per protocol for medication.      Patient advised to call the office if any issues arise.    Madie Reno

## 2022-07-27 NOTE — Telephone Encounter (Signed)
Unable to LVM. On return call please advise       May be from medicine. Depending on how bothersome we can keep him on it   and see if resolves with time. Otherwise we can stop the medicine and see   how things go.   If resolves off of med we would need to switch therapies.

## 2022-07-29 DIAGNOSIS — K080 Exfoliation of teeth due to systemic causes: Secondary | ICD-10-CM | POA: Diagnosis not present

## 2022-08-09 NOTE — Telephone Encounter (Signed)
Med check for Oral Oncolytic:       Called pt to perform med check for oral oncolytic:  Xtandi      Are you taking your medication as prescribed?  yes                Have you had any change in blood pressure, swelling in extremities, or noticed a rash on any part of your body? no    Have you had any changes in ability to perform usual daily activities, changes in weight or appetite, changes in ability to sleep, increase in fatigue or pain in the last 30 days? Still having the joint pain but would like to stay on medication to see if it resolves.      [facility] for specialty [facility].        Patient's next appointment with provider is scheduled for 09/22/22    Patient is up to date on all labs required per protocol for medication.      Patient advised to call the office if any issues arise.    Jeremy Green

## 2022-09-06 NOTE — Telephone Encounter (Signed)
Med check for Oral Oncolytic:       Called pt to perform med check for oral oncolytic:  Xtandi      Are you taking your medication as prescribed?  yes                Have you had any change in blood pressure, swelling in extremities, or noticed a rash on any part of your body? no    Have you had any changes in ability to perform usual daily activities, changes in weight or appetite, changes in ability to sleep, increase in fatigue or pain in the last 30 days? no    [facility] Patient has contact information for specialty pharmacy.        Patient's next appointment with provider is scheduled for 09/22/22    Patient is up to date on all labs required per protocol for medication.      Patient advised to call the office if any issues arise.    Jeremy Green

## 2022-09-08 DIAGNOSIS — H906 Mixed conductive and sensorineural hearing loss, bilateral: Secondary | ICD-10-CM | POA: Diagnosis not present

## 2022-09-08 DIAGNOSIS — H938X3 Other specified disorders of ear, bilateral: Secondary | ICD-10-CM | POA: Diagnosis not present

## 2022-09-10 ENCOUNTER — Encounter: Admit: 2022-09-10 | Discharge: 2022-09-10 | Payer: MEDICARE | Primary: Family

## 2022-09-10 DIAGNOSIS — E559 Vitamin D deficiency, unspecified: Secondary | ICD-10-CM

## 2022-09-10 NOTE — Progress Notes (Signed)
 [name] presents today for lab draw per Dr. Hellen order.   Dr. Zeno was present in the clinic as incident to.     PSA, TST, CMP, Vitamin D, and CBC  obtained via venipuncture without any difficulty.    Patient will be notified with lab results.       Orders Placed This Encounter   Procedures    COLLECTION VENOUS BLOOD,VENIPUNCTURE    CBC    Comprehensive Metabolic Panel    PSA, Diagnostic    Testosterone       Jeremy Green

## 2022-09-11 LAB — COMPREHENSIVE METABOLIC PANEL
ALT: 24 IU/L (ref 0–44)
AST: 31 IU/L (ref 0–40)
Albumin: 3.9 g/dL (ref 3.9–4.9)
Alkaline Phosphatase: 71 IU/L (ref 44–121)
BUN/Creatinine Ratio: 18 (ref 10–24)
BUN: 20 mg/dL (ref 8–27)
CO2: 27 mmol/L (ref 20–29)
Calcium: 9.8 mg/dL (ref 8.6–10.2)
Chloride: 101 mmol/L (ref 96–106)
Creatinine: 1.09 mg/dL (ref 0.76–1.27)
Est, Glom Filt Rate: 74 mL/min/{1.73_m2} (ref >59)
Globulin, Total: 2.8 g/dL (ref 1.5–4.5)
Glucose: 61 mg/dL — ABNORMAL LOW (ref 70–99)
Potassium: 3.8 mmol/L (ref 3.5–5.2)
Sodium: 141 mmol/L (ref 134–144)
Total Bilirubin: 0.6 mg/dL (ref 0.0–1.2)
Total Protein: 6.7 g/dL (ref 6.0–8.5)

## 2022-09-11 LAB — CBC
Hematocrit: 43.6 % (ref 37.5–51.0)
Hemoglobin: 15 g/dL (ref 13.0–17.7)
MCH: 29.9 pg (ref 26.6–33.0)
MCHC: 34.4 g/dL (ref 31.5–35.7)
MCV: 87 fL (ref 79–97)
Platelets: 235 10*3/uL (ref 150–450)
RBC: 5.02 x10E6/uL (ref 4.14–5.80)
RDW: 12.9 % (ref 11.6–15.4)
WBC: 6.9 10*3/uL (ref 3.4–10.8)

## 2022-09-11 LAB — TESTOSTERONE: Testosterone: <3 ng/dL — ABNORMAL LOW (ref 264–916)

## 2022-09-11 LAB — PSA, DIAGNOSTIC: PSA: <0.1 ng/mL (ref 0.0–4.0)

## 2022-09-11 LAB — SPECIMEN STATUS REPORT

## 2022-09-14 NOTE — Other (Signed)
 Will review results at follow up visit.     Karie Fetch, MD

## 2022-09-22 ENCOUNTER — Ambulatory Visit: Admit: 2022-09-22 | Payer: MEDICARE | Attending: Physician Assistant | Primary: Family

## 2022-09-22 VITALS — Ht 69.0 in | Wt 180.0 lb

## 2022-09-22 DIAGNOSIS — C61 Malignant neoplasm of prostate: Secondary | ICD-10-CM

## 2022-09-22 LAB — AMB POC URINALYSIS DIP STICK AUTO W/O MICRO
Bilirubin, Urine, POC: NEGATIVE
Blood, Urine, POC: NEGATIVE
Glucose, Urine, POC: NEGATIVE
Leukocyte Esterase, Urine, POC: NEGATIVE
Nitrite, Urine, POC: NEGATIVE
Protein, Urine, POC: NEGATIVE
Specific Gravity, Urine, POC: 1.015 (ref 1.001–1.035)
Urobilinogen, POC: 2
pH, Urine, POC: 6 (ref 4.6–8.0)

## 2022-09-22 NOTE — Progress Notes (Signed)
 Jeremy Green  1955/11/19      I am following the established plan for Dr. Hellen and Dr. Sobol is the supervising physician for this day.        Chief Complaint   Patient presents with    Prostate Cancer       ICD-10-CM    1. Prostate cancer (HCC)  C61 AMB POC URINALYSIS DIP STICK AUTO W/O MICRO              ASSESSMENT:  UA today: negative      1. Prostate Cancer- unknown grade [id number]   Prostatectomy in 2007, no ADT or XRT that patient aware of   Detectable PSA of 0.05 in [place] , PET scan was negative   PET scan 05/22/21- no abnormal hypermetabolic activity to suggest malignancy, of note prostate cancers are often not FDG avid   DRE 03/29/22- no palpable prostate   PSMA PET/CT 04/07/2022: Prostatectomy without evidence of tracer avid local recurrent neoplasm. A 7 mm mildly tracer avid preaortic lymph node with max SUV 3.0. Tracer avid 6 mm precaval lymph node with max SUV 3.4. Tracer avid 1.4 cm upper aortocaval lymph node with max SUV 4.5. These are concerning for tracer avid nodal metastases.   Most recent PSA 09/10/22- <0.1  03/29/2022 - 1.91 ng/mL.     2. Left Kidney Stones (no size or quantity) noted on PET 05/22/2021    3. 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 ED   Sp IPP      PLAN:    Previously DR. Unnikrishnan had a Long discussion with patient.  Reviewed his PSMA PET scan which did review reveal multiple retroperitoneal lymph nodes concerning for nodal metastatic disease.  PSA and T have appropriately responded to ADT  Camcevi  scheduled 12/24/22  Discussed use of supplements vs diet vs exercise to help with hot flashes  Return to clinic in 4 months with PSA, testosterone, CBC, CMP, and vitamin D prior.      HISTORY OF PRESENT ILLNESS:  Jeremy Green is a 67 y.o. male who is following up today for prostate cancer with rising PSA a condition that will require long term care. Last saw Dr. Hellen 05/2022. Prostatectomy in 2007. Patient has started ADT due to METS seen on PSMA and biochemical  recurrence of PSA.    Patient is having hot flashes. Previously had fatigue.     No family history of prostate, bladder or kidney cancer  Former smoker: quit in 1979, smoked for 5-10 years, 0.5 PPD  No industrial chemical or radiation exposure        LABS AND IMAGING:      PSA Trend  Lab Results   Component Value Date    PSA <0.1 09/10/2022    PSA 1.91 03/29/2022    PSA 1.5 03/19/2021    PSA 1.3 09/11/2020    PSA 1.1 12/17/2019    PSA 1.1 09/03/2019    PSA 0.9 10/05/2018        No results found for this visit on 09/22/22.    PSMA PET/CT 04/07/2022  CONCLUSION:  1. Prostatectomy without evidence of tracer avid local recurrent neoplasm.  2. Tracer avid upper retroperitoneal lymph nodes, concerning for nodal metastases.  3. Otherwise, no tracer avid distant metastatic disease.    Most recent imaging:  No image results found.        Past Medical History:   Diagnosis Date    GERD (gastroesophageal reflux disease)  HLD (hyperlipidemia)     HTN (hypertension)     Prostate cancer (HCC) 2007    prostatectomy; [place]        Past Surgical History:   Procedure Laterality Date    UROLOGICAL SURGERY  2007    prostatectomy       Social History     Tobacco Use    Smoking status: Former     Types: Cigarettes    Smokeless tobacco: Never   Substance Use Topics    Alcohol use: Never    Drug use: Never       No Known Allergies    No family history on file.    Current Outpatient Medications   Medication Sig Dispense Refill    loratadine (CLARITIN) 10 MG tablet 1 tab(s) orally once a day      enzalutamide  (XTANDI ) 40 MG capsule Take four capsules by mouth daily at bedtime 120 capsule 5    losartan-hydroCHLOROthiazide (HYZAAR) 50-12.5 MG per tablet Take 1 tablet by mouth daily      NIFEdipine (PROCARDIA XL) 60 MG extended release tablet Take 1 tablet by mouth daily      LOSARTAN POTASSIUM PO Take by mouth      hydroCHLOROthiazide (HYDRODIURIL) 50 MG tablet Take 1 tablet by mouth daily      pantoprazole (PROTONIX) 40 MG injection Infuse  40 mg intravenously daily      ezetimibe (ZETIA) 10 MG tablet Take 1 tablet by mouth daily      tiZANidine (ZANAFLEX) 2 MG tablet Take 1 tablet by mouth every 6 hours as needed      degarelix  (FIRMAGON ) 120 MG/VIAL SOLR chemo injection Inject 6 mLs into the skin once for 1 dose 6 mL 0     No current facility-administered medications for this visit.             PHYSICAL EXAMINATION:   Ht 1.753 m (5' 9)   Wt 81.6 kg (180 lb)   BMI 26.58 kg/m   Constitutional: WDWN, Pleasant and appropriate affect, No acute distress.    Respiratory: No respiratory distress or difficulties  Abdomen: protuberant abdomen  Skin: No jaundice.    Neuro/Psych:  Alert and oriented x 3, affect appropriate.   MSK: FROM      Patient's BMI is out of the normal parameters.  Information about BMI was given and patient was advised to follow-up with their PCP for further management.        A copy of today's office visit with all pertinent imaging results and labs were sent to the referring physician.        Wanda Meals, PA-C  [facility]    [address] [place], [address]  [place], [place] [address]  P: [phone]   F: [phone]

## 2022-11-01 ENCOUNTER — Ambulatory Visit (AMBULATORY_SURGERY_CENTER): Payer: Medicare Other

## 2022-11-01 VITALS — Ht 70.0 in | Wt 145.6 lb

## 2022-11-01 DIAGNOSIS — Z860100 Personal history of colon polyps, unspecified: Secondary | ICD-10-CM

## 2022-11-01 MED ORDER — NA SULFATE-K SULFATE-MG SULF 17.5-3.13-1.6 GM/177ML PO SOLN: 1.0000 | Freq: Once | ORAL | 0 refills | Status: AC

## 2022-11-01 NOTE — Progress Notes (Signed)

## 2022-11-01 NOTE — Telephone Encounter (Signed)
Med check for Oral Oncolytic:       Called pt to perform med check for oral oncolytic:  Xtandi      Are you taking your medication as prescribed?  yes                Have you had any change in blood pressure, swelling in extremities, or noticed a rash on any part of your body? no    Have you had any changes in ability to perform usual daily activities, changes in weight or appetite, changes in ability to sleep, increase in fatigue or pain in the last 30 days? Pt still report joint pain and involuntary twitching that is bothersome and wakes him up at night. Previously messaged Dr. Bishop Limbo in July regarding issue and he recommends to hold medication to see if issue resolve. Pt will hold medication for two weeks     [facility] Patient has contact information for specialty pharmacy.        Patient's next appointment with provider is scheduled for 02/15/22    Patient is up to date on all labs required per protocol for medication.      Patient advised to call the office if any issues arise.    Jeremy Green

## 2022-11-05 NOTE — Telephone Encounter (Signed)
Patient is requesting a refill for Xtandi.     Request refused at this time as the pt is currently holding drug due to side effects. Will send refill is he is going back on med at the end of the 2 week period.     Lona Millard, LPN

## 2022-11-15 DIAGNOSIS — E039 Hypothyroidism, unspecified: Secondary | ICD-10-CM | POA: Diagnosis not present

## 2022-11-15 DIAGNOSIS — Z0189 Encounter for other specified special examinations: Secondary | ICD-10-CM | POA: Diagnosis not present

## 2022-11-15 DIAGNOSIS — E785 Hyperlipidemia, unspecified: Secondary | ICD-10-CM | POA: Diagnosis not present

## 2022-11-15 DIAGNOSIS — Z0000 Encounter for general adult medical examination without abnormal findings: Secondary | ICD-10-CM | POA: Diagnosis not present

## 2022-11-15 DIAGNOSIS — R82998 Other abnormal findings in urine: Secondary | ICD-10-CM | POA: Diagnosis not present

## 2022-11-15 NOTE — Telephone Encounter (Signed)
Med check for Oral Oncolytic:     Spoke with pt and he stated his symptoms did resolve while off the medication but pt does not want to change or continue to stay off the medication. PT state he restarted the medication last night. Pt stated that the joint pain isn't bad but just uncomfortable at night. Pt stated its his nerves and wanted to know if he could have gabapentin to take to see if it helps. Informed I will send a message.       Jeremy Green

## 2022-11-18 NOTE — Telephone Encounter (Signed)
Spoke wit pt and informed him Dr. Bishop Limbo will defer prescribing that medication to his PCP. Pt also stated he PCP reminded another medication to help him sleep and would like to let Dr. Bishop Limbo to make sure its ok to take. Informed him to fin name and I will send message. PT voiced understanding.

## 2022-11-22 DIAGNOSIS — E039 Hypothyroidism, unspecified: Secondary | ICD-10-CM | POA: Diagnosis not present

## 2022-11-22 DIAGNOSIS — Z0000 Encounter for general adult medical examination without abnormal findings: Secondary | ICD-10-CM | POA: Diagnosis not present

## 2022-11-22 DIAGNOSIS — Z1339 Encounter for screening examination for other mental health and behavioral disorders: Secondary | ICD-10-CM | POA: Diagnosis not present

## 2022-11-22 DIAGNOSIS — Z23 Encounter for immunization: Secondary | ICD-10-CM | POA: Diagnosis not present

## 2022-11-22 DIAGNOSIS — Z1331 Encounter for screening for depression: Secondary | ICD-10-CM | POA: Diagnosis not present

## 2022-11-23 ENCOUNTER — Encounter: Payer: Self-pay | Admitting: Gastroenterology

## 2022-11-23 DIAGNOSIS — H524 Presbyopia: Secondary | ICD-10-CM | POA: Diagnosis not present

## 2022-11-23 MED ORDER — ENZALUTAMIDE 40 MG PO CAPS
40 MG | ORAL_CAPSULE | ORAL | 5 refills | Status: DC
Start: 2022-11-23 — End: 2023-04-21

## 2022-11-23 NOTE — Telephone Encounter (Signed)
 Patient is requesting a refill for Xtandi.     Pt is being seen every 4 month(s),was last seen on 09/22/22.      Per last office note patient is to continue taking the above medication and has a follow-up appointment scheduled for 02/16/23.    Patient is u

## 2022-11-23 NOTE — Telephone Encounter (Signed)
 Pt is calling to advise he needs a refill of Xtandi sent to [facility] on file. Pt states he has about 50 pills left      CBN [phone]

## 2022-11-28 ENCOUNTER — Encounter: Payer: Self-pay | Admitting: Certified Registered Nurse Anesthetist

## 2022-11-30 ENCOUNTER — Telehealth: Payer: Self-pay | Admitting: Gastroenterology

## 2022-11-30 NOTE — Telephone Encounter (Signed)
Patient requesting a call back to discuss procedure. Please advise.

## 2022-11-30 NOTE — Telephone Encounter (Signed)
Called and spoke with patient- patient is requesting to know if Dr. Adela Lank is able to complete the colonoscopy if he has hemorrhoids; RN advised patient, yes the provider would be able to complete the colonoscopy if he has hemorrhoids and after the procedure is done, the provider would also advise on treatment plan for hemorrhoids if patient does truly have hemorrhoids;  Patient verbalized understanding of information/instructions;   Patient advised to call back to the office at [phone] should questions/concerns arise;

## 2022-12-06 ENCOUNTER — Encounter: Payer: Self-pay | Admitting: Gastroenterology

## 2022-12-06 ENCOUNTER — Ambulatory Visit: Payer: Medicare Other | Admitting: Gastroenterology

## 2022-12-06 VITALS — BP 116/80 | HR 65 | Temp 98.9°F | Resp 8 | Ht 70.0 in | Wt 145.6 lb

## 2022-12-06 DIAGNOSIS — D123 Benign neoplasm of transverse colon: Secondary | ICD-10-CM | POA: Diagnosis not present

## 2022-12-06 DIAGNOSIS — Z1211 Encounter for screening for malignant neoplasm of colon: Secondary | ICD-10-CM

## 2022-12-06 DIAGNOSIS — K5730 Diverticulosis of large intestine without perforation or abscess without bleeding: Secondary | ICD-10-CM | POA: Diagnosis not present

## 2022-12-06 DIAGNOSIS — K648 Other hemorrhoids: Secondary | ICD-10-CM | POA: Diagnosis not present

## 2022-12-06 DIAGNOSIS — Z860100 Personal history of colon polyps, unspecified: Secondary | ICD-10-CM

## 2022-12-06 DIAGNOSIS — D125 Benign neoplasm of sigmoid colon: Secondary | ICD-10-CM

## 2022-12-06 DIAGNOSIS — Z860101 Personal history of adenomatous and serrated colon polyps: Secondary | ICD-10-CM

## 2022-12-06 DIAGNOSIS — K644 Residual hemorrhoidal skin tags: Secondary | ICD-10-CM

## 2022-12-06 DIAGNOSIS — K635 Polyp of colon: Secondary | ICD-10-CM | POA: Diagnosis not present

## 2022-12-06 MED ORDER — SODIUM CHLORIDE 0.9 % IV SOLN: 500.0000 mL | INTRAVENOUS | Status: DC

## 2022-12-06 NOTE — Op Note (Signed)
[facility] Patient Name: Jon Leonard Procedure Date: 12/06/2022 11:11 AM MRN: [id number] Endoscopist: Viviann Spare P. Adela Lank , MD, [id number] Age: 67 Referring MD:  Date of Birth: [birth year] Gender: Male Account #: [id number] Procedure:                Colonoscopy Indications:              High risk colon cancer surveillance: Personal                            history of colonic polyps - last exam Dr. Christella Hartigan -                            10/2019 - 4 adenomas, largest 11mm in size Medicines:                Monitored Anesthesia Care Procedure:                Pre-Anesthesia Assessment:                           - Prior to the procedure, a History and Physical                            was performed, and patient medications and                            allergies were reviewed. The patient's tolerance of                            previous anesthesia was also reviewed. The risks                            and benefits of the procedure and the sedation                            options and risks were discussed with the patient.                            All questions were answered, and informed consent                            was obtained. Prior Anticoagulants: The patient has                            taken no anticoagulant or antiplatelet agents. ASA                            Grade Assessment: II - A patient with mild systemic                            disease. After reviewing the risks and benefits,                            the patient was deemed in satisfactory condition to  undergo the procedure.                           After obtaining informed consent, the colonoscope                            was passed under direct vision. Throughout the                            procedure, the patient's blood pressure, pulse, and                            oxygen saturations were monitored continuously. The                            Olympus  Scope SN [id number] was introduced through the                            anus and advanced to the the cecum, identified by                            appendiceal orifice and ileocecal valve. The                            colonoscopy was performed without difficulty. The                            patient tolerated the procedure well. The quality                            of the bowel preparation was good. The ileocecal                            valve, appendiceal orifice, and rectum were                            photographed. Scope In: 11:22:57 AM Scope Out: 11:44:20 AM Scope Withdrawal Time: 0 hours 16 minutes 27 seconds  Total Procedure Duration: 0 hours 21 minutes 23 seconds  Findings:                 Skin tags were found on perianal exam.                           Many medium-mouthed diverticula were found in the                            entire colon.                           A 3 mm polyp was found in the transverse colon. The                            polyp was sessile. The polyp was removed with a  cold snare. Resection and retrieval were complete.                           A 10 mm polyp was found in the distal sigmoid                            colon. The polyp was pedunculated. The polyp was                            removed with a hot snare. Resection and retrieval                            were complete.                           Internal hemorrhoids were found during retroflexion.                           The exam was otherwise without abnormality. Complications:            No immediate complications. Estimated blood loss:                            Minimal. Estimated Blood Loss:     Estimated blood loss was minimal. Impression:               - Perianal skin tags found on perianal exam.                           - Diverticulosis in the entire examined colon.                           - One 3 mm polyp in the transverse colon, removed                             with a cold snare. Resected and retrieved.                           - One 10 mm polyp in the distal sigmoid colon,                            removed with a hot snare. Resected and retrieved.                           - Internal hemorrhoids.                           - The examination was otherwise normal. Recommendation:           - Patient has a contact number available for                            emergencies. The signs and symptoms of potential  delayed complications were discussed with the                            patient. Return to normal activities tomorrow.                            Written discharge instructions were provided to the                            patient.                           - Resume previous diet.                           - Continue present medications.                           - Await pathology results. Viviann Spare P. Adela Lank, MD 12/06/2022 11:48:48 AM This report has been signed electronically.

## 2022-12-06 NOTE — Progress Notes (Signed)
Called to room to assist during endoscopic procedure.  Patient ID and intended procedure confirmed with present staff. Received instructions for my participation in the procedure from the performing physician.  

## 2022-12-06 NOTE — Patient Instructions (Signed)
Resume all of your previous medications today as ordered.  Read all of  your discharge instructions.  YOU HAD AN ENDOSCOPIC PROCEDURE TODAY AT THE [facility]:   Refer to the procedure report that was given to you for any specific questions about what was found during the examination.  If the procedure report does not answer your questions, please call your gastroenterologist to clarify.  If you requested that your care partner not be given the details of your procedure findings, then the procedure report has been included in a sealed envelope for you to review at your convenience later.  YOU SHOULD EXPECT: Some feelings of bloating in the abdomen. Passage of more gas than usual.  Walking can help get rid of the air that was put into your GI tract during the procedure and reduce the bloating. If you had a lower endoscopy (such as a colonoscopy or flexible sigmoidoscopy) you may notice spotting of blood in your stool or on the toilet paper. If you underwent a bowel prep for your procedure, you may not have a normal bowel movement for a few days.  Please Note:  You might notice some irritation and congestion in your nose or some drainage.  This is from the oxygen used during your procedure.  There is no need for concern and it should clear up in a day or so.  SYMPTOMS TO REPORT IMMEDIATELY:  Following lower endoscopy (colonoscopy or flexible sigmoidoscopy):  Excessive amounts of blood in the stool  Significant tenderness or worsening of abdominal pains  Swelling of the abdomen that is new, acute  Fever of 100F or higher   For urgent or emergent issues, a gastroenterologist can be reached at any hour by calling [phone]. Do not use MyChart messaging for urgent concerns.    DIET:  We do recommend a small meal at first, but then you may proceed to your regular diet.  Drink plenty of fluids but you should avoid alcoholic beverages for 24 hours.  ACTIVITY:  You should plan to take  it easy for the rest of today and you should NOT DRIVE or use heavy machinery until tomorrow (because of the sedation medicines used during the test).    FOLLOW UP: Our staff will call the number listed on your records the next business day following your procedure.  We will call around 7:15- 8:00 am to check on you and address any questions or concerns that you may have regarding the information given to you following your procedure. If we do not reach you, we will leave a message.     If any biopsies were taken you will be contacted by phone or by letter within the next 1-3 weeks.  Please call us at [phone] if you have not heard about the biopsies in 3 weeks.    SIGNATURES/CONFIDENTIALITY: You and/or your care partner have signed paperwork which will be entered into your electronic medical record.  These signatures attest to the fact that that the information above on your After Visit Summary has been reviewed and is understood.  Full responsibility of the confidentiality of this discharge information lies with you and/or your care-partner.

## 2022-12-06 NOTE — Progress Notes (Signed)
[facility] History and Physical   Primary Care Physician:  Geoffry Paradise, MD   Reason for Procedure:   History of colon polyps  Plan:    colonoscopy     HPI: Jon Leonard is a 67 y.o. male  here for colonoscopy surveillance - last exam with Dr. Christella Hartigan 10/2019 - 4 adenomas removed, largest 11mm in size.   Patient denies any bowel symptoms at this time. Father had colon resection for advanced polyp age 21s. Otherwise feels well without any cardiopulmonary symptoms.   I have discussed risks / benefits of anesthesia and endoscopic procedure with Jon Leonard and they wish to proceed with the exams as outlined today.    Past Medical History:  Diagnosis Date   Blood in stool    Diverticulosis    Duodenitis    Esophagitis    Gastritis    GERD (gastroesophageal reflux disease)    Hemorrhoids    Thyroid disease    Tubular adenoma     Past Surgical History:  Procedure Laterality Date   COLONOSCOPY     HERNIA REPAIR     TONSILECTOMY, ADENOIDECTOMY, BILATERAL MYRINGOTOMY AND TUBES     UPPER GASTROINTESTINAL ENDOSCOPY      Prior to Admission medications   Medication Sig Start Date End Date Taking? Authorizing Provider  calcium carbonate (TUMS - DOSED IN MG ELEMENTAL CALCIUM) 500 MG chewable tablet Chew 1 tablet by mouth daily.   Yes [provider]  finasteride (PROSCAR) 5 MG tablet 1 tablet Orally Once a day for 90 days 11/22/22  Yes [provider]  levothyroxine (SYNTHROID, LEVOTHROID) 25 MCG tablet Take 25 mcg by mouth daily before breakfast.   Yes [provider]  omeprazole (PRILOSEC) 20 MG capsule Take 20 mg by mouth daily.    [provider]    Current Outpatient Medications  Medication Sig Dispense Refill   calcium carbonate (TUMS - DOSED IN MG ELEMENTAL CALCIUM) 500 MG chewable tablet Chew 1 tablet by mouth daily.     finasteride (PROSCAR) 5 MG tablet 1 tablet Orally Once a day for 90 days      levothyroxine (SYNTHROID, LEVOTHROID) 25 MCG tablet Take 25 mcg by mouth daily before breakfast.     omeprazole (PRILOSEC) 20 MG capsule Take 20 mg by mouth daily.     Current Facility-Administered Medications  Medication Dose Route Frequency Provider Last Rate Last Admin   0.9 %  sodium chloride infusion  500 mL Intravenous Continuous Esker Dever, Willaim Rayas, MD        Allergies as of 12/06/2022 - Review Complete 12/06/2022  Allergen Reaction Noted   Penicillins  01/22/2013    Family History  Problem Relation Age of Onset   Colon cancer Father    Diabetes Sister    Esophageal cancer Neg Hx    Rectal cancer Neg Hx    Stomach cancer Neg Hx     Social History   Socioeconomic History   Marital status: Single    Spouse name: Not on file   Number of children: Not on file   Years of education: Not on file   Highest education level: Not on file  Occupational History   Not on file  Tobacco Use   Smoking status: Never   Smokeless tobacco: Never  Vaping Use   Vaping status: Never Used  Substance and Sexual Activity   Alcohol use: Yes    Alcohol/week: 3.0 standard drinks of alcohol    Types: 3 Cans of beer  per week    Comment: occ   Drug use: No   Sexual activity: Not on file  Other Topics Concern   Not on file  Social History Narrative   Not on file   Social Determinants of Health   Financial Resource Strain: Not on file  Food Insecurity: Not on file  Transportation Needs: Not on file  Physical Activity: Not on file  Stress: Not on file  Social Connections: Not on file  Intimate Partner Violence: Not on file    Review of Systems: All other review of systems negative except as mentioned in the HPI.  Physical Exam: Vital signs BP 96/61   Pulse 88   Temp 98.9 F (37.2 C)   Ht 5\' 10"  (1.778 m)   Wt 145 lb 9.6 oz (66 kg)   SpO2 98%   BMI 20.89 kg/m   General:   Alert,  Well-developed, pleasant and cooperative in NAD Lungs:  Clear throughout to auscultation.    Heart:  Regular rate and rhythm Abdomen:  Soft, nontender and nondistended.   Neuro/Psych:  Alert and cooperative. Normal mood and affect. A and O x 3  Harlin Rain, MD [facility]

## 2022-12-06 NOTE — Progress Notes (Signed)
Report given to PACU, vss 

## 2022-12-06 NOTE — Progress Notes (Signed)
Pt's states no medical or surgical changes since previsit or office visit. 

## 2022-12-07 ENCOUNTER — Telehealth: Payer: Self-pay | Admitting: *Deleted

## 2022-12-07 NOTE — Telephone Encounter (Signed)
No answer left VM

## 2022-12-08 LAB — SURGICAL PATHOLOGY

## 2022-12-13 ENCOUNTER — Encounter: Payer: Self-pay | Admitting: Gastroenterology

## 2022-12-13 DIAGNOSIS — H938X3 Other specified disorders of ear, bilateral: Secondary | ICD-10-CM | POA: Diagnosis not present

## 2022-12-13 NOTE — Telephone Encounter (Signed)
 Med check for Oral Oncolytic:     Called pt to perform med check for oral oncolytic: Xtandi      Are you taking your medication as prescribed?  Yes                 Have you had any change in blood pressure, swelling in extremities, or noticed a rash on any

## 2022-12-29 ENCOUNTER — Encounter: Admit: 2022-12-29 | Discharge: 2022-12-29 | Payer: MEDICARE | Primary: Family

## 2022-12-29 DIAGNOSIS — C61 Malignant neoplasm of prostate: Secondary | ICD-10-CM

## 2022-12-29 LAB — TESTOSTERONE: Testosterone: <3 ng/dL — ABNORMAL LOW (ref 348–1197)

## 2022-12-29 LAB — PROSTATE SPECIFIC ANTIGEN, TOTAL: PSA: <0.03 ng/mL (ref 0–4)

## 2022-12-29 MED ORDER — LEUPROLIDE MESYLATE (6 MONTH) 42 MG SC PRSY
42 | Freq: Once | SUBCUTANEOUS | 0 refills | Status: DC
Start: 2022-12-29 — End: 2022-12-29

## 2022-12-29 NOTE — Progress Notes (Signed)
 Jeremy Green is a 67 y.o. male who is here today per the order of Dr. Doreene Burke to receive CAMCEVI 42mg  and to have labs drawn.   Patient's identity has been verified.   Dr. Barnetta Chapel was available in the clinic as incident to provider.     This is not h

## 2023-02-07 DIAGNOSIS — K080 Exfoliation of teeth due to systemic causes: Secondary | ICD-10-CM | POA: Diagnosis not present

## 2023-02-07 NOTE — Telephone Encounter (Signed)
Med check for Oral Oncolytic:       Called pt to perform med check for oral oncolytic:  Xtandi      Are you taking your medication as prescribed?  yes                Have you had any change in blood pressure, swelling in extremities, or noticed a rash on any part of your body? no    Have you had any changes in ability to perform usual daily activities, changes in weight or appetite, changes in ability to sleep, increase in fatigue or pain in the last 30 days? no    [facility]. Patient has contact information for specialty pharmacy.        Patient's next appointment with provider is scheduled for 02/16/23    Patient is up to date on all labs required per protocol for medication.      Patient advised to call the office if any issues arise.    Jeremy Green

## 2023-02-09 ENCOUNTER — Encounter: Admit: 2023-02-09 | Discharge: 2023-02-10 | Payer: MEDICARE | Primary: Family

## 2023-02-09 DIAGNOSIS — C61 Malignant neoplasm of prostate: Secondary | ICD-10-CM

## 2023-02-09 NOTE — Progress Notes (Signed)
Jeremy Green presents today for lab draw per Dr. Marcie Mowers order.   Dr. Nathaneil Canary was present in the clinic as incident to.     PSA, TST, CMP, Vitamin D, and  CBC obtained via venipuncture without any difficulty.    Patient will be notified with lab results.       Orders Placed This Encounter   Procedures    COLLECTION VENOUS BLOOD,VENIPUNCTURE    Comprehensive Metabolic Panel    CBC    Testosterone    Prostate Specific Antigen, Total    Vitamin D, 25 Hydroxy       Laren Boom

## 2023-02-09 NOTE — Addendum Note (Signed)
Addended by: Laren Boom on: 02/09/2023 03:37 PM     Modules accepted: Orders

## 2023-02-10 LAB — CBC
Hematocrit: 44.4 % (ref 37.5–51.0)
Hemoglobin: 14.7 g/dL (ref 13.0–17.7)
MCH: 28.6 pg (ref 26.6–33.0)
MCHC: 33.1 g/dL (ref 31.5–35.7)
MCV: 86 fL (ref 79–97)
Platelets: 247 10*3/uL (ref 150–450)
RBC: 5.14 x10E6/uL (ref 4.14–5.80)
RDW: 12.9 % (ref 11.6–15.4)
WBC: 6.9 10*3/uL (ref 3.4–10.8)

## 2023-02-10 LAB — TESTOSTERONE: Testosterone: <3 ng/dL — ABNORMAL LOW (ref 264–916)

## 2023-02-10 LAB — PSA, DIAGNOSTIC: PSA: <0.1 ng/mL (ref 0.0–4.0)

## 2023-02-11 LAB — COMPREHENSIVE METABOLIC PANEL
ALT: 36 [IU]/L (ref 0–44)
AST: 47 [IU]/L — ABNORMAL HIGH (ref 0–40)
Albumin: 4.1 g/dL (ref 3.9–4.9)
Alkaline Phosphatase: 108 [IU]/L (ref 44–121)
BUN/Creatinine Ratio: 12 (ref 10–24)
BUN: 13 mg/dL (ref 8–27)
CO2: 24 mmol/L (ref 20–29)
Calcium: 9.8 mg/dL (ref 8.6–10.2)
Chloride: 101 mmol/L (ref 96–106)
Creatinine: 1.13 mg/dL (ref 0.76–1.27)
Est, Glom Filt Rate: 70 mL/min/{1.73_m2} (ref >59)
Globulin, Total: 2.9 g/dL (ref 1.5–4.5)
Glucose: 73 mg/dL (ref 70–99)
Potassium: 3.7 mmol/L (ref 3.5–5.2)
Sodium: 141 mmol/L (ref 134–144)
Total Bilirubin: 0.7 mg/dL (ref 0.0–1.2)
Total Protein: 7 g/dL (ref 6.0–8.5)

## 2023-02-14 DIAGNOSIS — K080 Exfoliation of teeth due to systemic causes: Secondary | ICD-10-CM | POA: Diagnosis not present

## 2023-02-16 ENCOUNTER — Ambulatory Visit: Admit: 2023-02-16 | Payer: MEDICARE | Attending: Physician Assistant | Primary: Family

## 2023-02-16 VITALS — Ht 69.0 in | Wt 180.0 lb

## 2023-02-16 DIAGNOSIS — C61 Malignant neoplasm of prostate: Secondary | ICD-10-CM

## 2023-02-16 LAB — AMB POC URINALYSIS DIP STICK AUTO W/O MICRO
Bilirubin, Urine, POC: NEGATIVE
Blood, Urine, POC: NEGATIVE
Leukocyte Esterase, Urine, POC: NEGATIVE
Nitrite, Urine, POC: NEGATIVE
Protein, Urine, POC: NEGATIVE
Specific Gravity, Urine, POC: 1.02 (ref 1.001–1.035)
Urobilinogen, POC: NORMAL
pH, Urine, POC: 5.5 (ref 4.6–8.0)

## 2023-02-16 NOTE — Addendum Note (Signed)
Addended by: Donald Prose on: 02/16/2023 08:54 AM     Modules accepted: Orders

## 2023-02-16 NOTE — Progress Notes (Addendum)
 Jeremy Green  1955/05/08      I am following the established plan for Dr. Doreene Burke and Dr. Nathaneil Canary is the supervising physician for this day.        No chief complaint on file.      ICD-10-CM    1. Prostate cancer (HCC)  C61                 ASSESSMENT:  UA today: trace glucose      1. Prostate Cancer- unknown grade [id number]   Prostatectomy in 2007, no ADT or XRT that patient aware of   Detectable PSA of 0.05 in [place], PET scan was negative   PET scan 05/22/21- no abnormal hypermetabolic activity to suggest malignancy, of note prostate cancers are often not FDG avid   DRE 03/29/22- no palpable prostate   PSMA PET/CT 04/07/2022: Prostatectomy without evidence of tracer avid local recurrent neoplasm. A 7 mm mildly tracer avid preaortic lymph node with max SUV 3.0. Tracer avid 6 mm precaval lymph node with max SUV 3.4. Tracer avid 1.4 cm upper aortocaval lymph node with max SUV 4.5. These are concerning for tracer avid nodal metastases.   Most recent PSA 02/08/22- <0.1     12/29/22- <0.03  09/10/22- <0.1  03/29/2022 - 1.91 ng/mL.     2. Left Kidney Stones (no size or quantity) noted on PET 05/22/2021    3. 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 ED   Sp IPP    Last Camcevi injection 12/29/22. Next 07/02/23      PLAN:    Previously DR. Unnikrishnan had a Long discussion with patient.  Reviewed his PSMA PET scan which did  reveal multiple retroperitoneal lymph nodes concerning for nodal metastatic disease.  PSA and T have appropriately responded to ADT  Camcevi scheduled 07/02/23  Continue xtandi  AST slightly elevated- repeat in 1 month  Discussed use of supplements vs diet vs exercise to help with hot flashes  Return to clinic in 5 months with PSA, testosterone, CBC, CMP, and vitamin D prior.      HISTORY OF PRESENT ILLNESS:  Jeremy Green is a 68 y.o. male who is following up today for prostate cancer with rising PSA a condition that will require long term care. Last saw Dr. Doreene Burke 05/2022. Prostatectomy in 2007.  Patient has started ADT due to METS seen on PSMA and biochemical recurrence of PSA.    Patient is having hot flashes. Previously had fatigue. Good FOS, no sense of PVR.  Denies flank pain, gross hematuria, dysuria, asymptomatic for infection. No f/c/n/v.       No family history of prostate, bladder or kidney cancer  Former smoker: quit in 1979, smoked for 5-10 years, 0.5 PPD  No industrial chemical or radiation exposure        LABS AND IMAGING:      PSA Trend  Lab Results   Component Value Date    PSA <0.1 02/09/2023    PSA <0.03 12/29/2022    PSA <0.1 09/10/2022    PSA 1.91 03/29/2022    PSA 1.5 03/19/2021    PSA 1.3 09/11/2020    PSA 1.1 12/17/2019        No results found for this visit on 02/16/23.    PSMA PET/CT 04/07/2022  CONCLUSION:  1. Prostatectomy without evidence of tracer avid local recurrent neoplasm.  2. Tracer avid upper retroperitoneal lymph nodes, concerning for nodal metastases.  3. Otherwise, no tracer avid distant metastatic  disease.    Most recent imaging:  No image results found.        Past Medical History:   Diagnosis Date    GERD (gastroesophageal reflux disease)     HLD (hyperlipidemia)     HTN (hypertension)     Prostate cancer (HCC) 2007    prostatectomy; [place]       Past Surgical History:   Procedure Laterality Date    UROLOGICAL SURGERY  2007    prostatectomy       Social History     Tobacco Use    Smoking status: Former     Types: Cigarettes    Smokeless tobacco: Never   Substance Use Topics    Alcohol use: Never    Drug use: Never       No Known Allergies    No family history on file.    Current Outpatient Medications   Medication Sig Dispense Refill    enzalutamide (XTANDI) 40 MG capsule Take four capsules by mouth daily at bedtime 120 capsule 5    loratadine (CLARITIN) 10 MG tablet 1 tab(s) orally once a day      degarelix (FIRMAGON) 120 MG/VIAL SOLR chemo injection Inject 6 mLs into the skin once for 1 dose 6 mL 0    losartan-hydroCHLOROthiazide (HYZAAR) 50-12.5 MG per tablet  Take 1 tablet by mouth daily      NIFEdipine (PROCARDIA XL) 60 MG extended release tablet Take 1 tablet by mouth daily      LOSARTAN POTASSIUM PO Take by mouth      hydroCHLOROthiazide (HYDRODIURIL) 50 MG tablet Take 1 tablet by mouth daily      pantoprazole (PROTONIX) 40 MG injection Infuse 40 mg intravenously daily      ezetimibe (ZETIA) 10 MG tablet Take 1 tablet by mouth daily      tiZANidine (ZANAFLEX) 2 MG tablet Take 1 tablet by mouth every 6 hours as needed       No current facility-administered medications for this visit.             PHYSICAL EXAMINATION:   There were no vitals taken for this visit.  Constitutional: WDWN, Pleasant and appropriate affect, No acute distress.    Respiratory: No respiratory distress or difficulties  Abdomen: protuberant abdomen  Skin: No jaundice.    Neuro/Psych:  Alert and oriented x 3, affect appropriate.   MSK: FROM      Patient's BMI is out of the normal parameters.  Information about BMI was given and patient was advised to follow-up with their PCP for further management.        A copy of today's office visit with all pertinent imaging results and labs were sent to the referring physician.        London Sheer, PA-C  [facility]   [address] [place], [address]  [place], [place] [address]  P: [phone]   F: [phone]

## 2023-02-28 DIAGNOSIS — L720 Epidermal cyst: Secondary | ICD-10-CM | POA: Diagnosis not present

## 2023-02-28 DIAGNOSIS — L821 Other seborrheic keratosis: Secondary | ICD-10-CM | POA: Diagnosis not present

## 2023-02-28 DIAGNOSIS — Z85828 Personal history of other malignant neoplasm of skin: Secondary | ICD-10-CM | POA: Diagnosis not present

## 2023-02-28 DIAGNOSIS — L905 Scar conditions and fibrosis of skin: Secondary | ICD-10-CM | POA: Diagnosis not present

## 2023-03-23 ENCOUNTER — Encounter: Admit: 2023-03-23 | Payer: MEDICARE | Primary: Family

## 2023-03-23 DIAGNOSIS — R748 Abnormal levels of other serum enzymes: Secondary | ICD-10-CM

## 2023-03-23 NOTE — Progress Notes (Signed)
 Jeremy Green presents today for lab draw per PA Aloia order.   Dr. Barnetta Chapel was present in the clinic as incident to.     CMP obtained via venipuncture without any difficulty.    Patient will be notified with lab results.       Orders Placed This Encounter   Procedures    COLLECTION VENOUS BLOOD,VENIPUNCTURE    Comprehensive Metabolic Panel       [employer]

## 2023-03-24 LAB — COMPREHENSIVE METABOLIC PANEL
ALT: 38 IU/L (ref 0–44)
AST: 50 IU/L — ABNORMAL HIGH (ref 0–40)
Albumin: 4.3 g/dL (ref 3.9–4.9)
Alkaline Phosphatase: 119 IU/L (ref 44–121)
BUN/Creatinine Ratio: 16 (ref 10–24)
BUN: 16 mg/dL (ref 8–27)
CO2: 24 mmol/L (ref 20–29)
Calcium: 9.7 mg/dL (ref 8.6–10.2)
Chloride: 103 mmol/L (ref 96–106)
Creatinine: 1.03 mg/dL (ref 0.76–1.27)
Est, Glom Filt Rate: 79 mL/min/{1.73_m2} (ref >59)
Globulin, Total: 2.7 g/dL (ref 1.5–4.5)
Glucose: 73 mg/dL (ref 70–99)
Potassium: 3.8 mmol/L (ref 3.5–5.2)
Sodium: 142 mmol/L (ref 134–144)
Total Bilirubin: 0.8 mg/dL (ref 0.0–1.2)
Total Protein: 7 g/dL (ref 6.0–8.5)

## 2023-03-28 NOTE — Other (Signed)
 Reviewed labs with Dr. Doreene Burke. Continue camcevi and xtandi. He advises to follow up with PCP because liver labs are mildly elevated still.

## 2023-03-29 NOTE — Telephone Encounter (Signed)
 I called the patient and informed him that Dr. Doreene Burke stated we're going to Continue camcevi and xtandi and he advises him to follow up with PCP because liver labs are mildly elevated still. He thanked me and the call was ended.

## 2023-03-29 NOTE — Telephone Encounter (Signed)
-----   Message from London Sheer, [place] sent at 03/28/2023  3:38 PM EDT -----  Reviewed labs with Dr. Doreene Burke. Continue camcevi and xtandi. He advises to follow up with PCP because liver labs are mildly elevated still.

## 2023-04-04 NOTE — Telephone Encounter (Signed)
 Med check for Oral Oncolytic:     Called pt to perform med check for oral oncolytic: Xtandi      Pt did not answer call for med check. LVM advising he call back with any questions/concerns.     Lona Millard, LPN

## 2023-04-13 DIAGNOSIS — H938X3 Other specified disorders of ear, bilateral: Secondary | ICD-10-CM | POA: Diagnosis not present

## 2023-04-21 MED ORDER — XTANDI 40 MG PO CAPS
40 | ORAL_CAPSULE | ORAL | 5 refills | Status: AC
Start: 2023-04-21 — End: ?

## 2023-04-21 NOTE — Telephone Encounter (Signed)
 Patient is requesting a refill for Xtandi.     Pt is being seen every 5 month(s),was last seen on 02/16/23.      Per last office note patient is to continue taking the above medication and has a follow-up appointment scheduled for 08/10/23.    Patient is up to date on ADT utilizing Camcevi.      Requested medication has been sent to the pharmacy on file.    Lona Millard, LPN

## 2023-05-02 NOTE — Telephone Encounter (Signed)
 Med check for Oral Oncolytic:     Called pt to perform med check for oral oncolytic:  Xtandi       Pt didn't answer call for med check. LVM advising he call back with any questions/concerns.     Bernardo Bridgeman, LPN

## 2023-05-30 NOTE — Telephone Encounter (Signed)
 Pt has been on Xtandi  since 06/16/22 and per all med checks has been tolerating very well. Pt is up to date on ADT, follow up, and lab work. Pt has been moved to quarterly chart checks and has contact information for the navigation team should he have any questions/concerns/side effects.

## 2023-06-29 ENCOUNTER — Ambulatory Visit: Admit: 2023-06-29 | Discharge: 2023-06-29 | Payer: MEDICARE | Primary: Family

## 2023-06-29 DIAGNOSIS — C61 Malignant neoplasm of prostate: Secondary | ICD-10-CM

## 2023-06-29 MED ORDER — LEUPROLIDE MESYLATE (6 MONTH) 42 MG SC PRSY
42 | Freq: Once | SUBCUTANEOUS | 0 refills | Status: DC
Start: 2023-06-29 — End: 2023-06-29

## 2023-06-29 NOTE — Progress Notes (Signed)
 Jeremy Green is a 68 y.o. male who is here today per the order of Dr. Ardith to receive camcevi  injection and to have labs drawn.   Patient's identity has been verified.   Dr. Cheryn was available in the clinic as incident to provider.     Patient has been taking supplements of at least 500 mg of calcium and 400 international units of Vitamin D daily.  This is not his first ADT injection.  Last injection:18December2024.  He has  had the necessary labs drawn.    Camcevi  42mg /mL is administered to abdomen ( left) SQ without difficulty.     Patient tolerated the injection well.    Possible side effects reviewed with patient YES  Patient has been scheduled for the next injection which will be due in approximately 6 months        Encounter Diagnosis   Name Primary?    Prostate cancer (HCC) Yes       Orders Placed This Encounter   Procedures    PR LEUPROLIDE  INJ, CAMCEVI , 1MG      Dose:   1     Site:   LEFT LOWER QUAD ABDOMEN     Expiration Date:   05/10/2024     Lot#:   [id number]     Manufacturer:   [employer]     Oceanographer?:   6     Perfomed by/Witnessed by::   Holli Mace     NDC#:   30551-985-36 [[id number]]    CHEMOTHER HORMON ANTINEOPL SUB-Q/IM       Patient did not supply own medication.      HOLLI MACE       I was present and available in clinic at time of appointment    Belvie Cheryn, MD  [facility] [place] [facility]  [address] [place]   [place], [place] [address]  [phone] (office)

## 2023-08-03 ENCOUNTER — Ambulatory Visit: Admit: 2023-08-03 | Payer: MEDICARE | Primary: Family

## 2023-08-03 DIAGNOSIS — C61 Malignant neoplasm of prostate: Principal | ICD-10-CM

## 2023-08-03 LAB — PSA,  (DIAGNOSTIC UVA): PSA: <0.03 ng/mL (ref 0–4)

## 2023-08-03 LAB — TESTOSTERONE: Testosterone: <3 ng/dL — ABNORMAL LOW (ref 348–1197)

## 2023-08-03 NOTE — Progress Notes (Signed)
 Jeremy Green presents today for lab draw per PA Aloia order.   Dr. Cheryn was present in the clinic as incident to.     ADT labs obtained via venipuncture without any difficulty.    Patient will be notified with lab results.       Orders Placed This Encounter   Procedures    COLLECTION VENOUS BLOOD,VENIPUNCTURE    PSA,  (Diagnostic [facility])    Testosterone    CBC    Comprehensive Metabolic Panel    Vitamin D 25 Hydroxy       [name]

## 2023-08-04 LAB — COMPREHENSIVE METABOLIC PANEL
ALT: 79 IU/L — ABNORMAL HIGH (ref 0–44)
AST: 92 IU/L — ABNORMAL HIGH (ref 0–40)
Albumin: 3.6 g/dL — ABNORMAL LOW (ref 3.9–4.9)
Alkaline Phosphatase: 112 IU/L (ref 44–121)
BUN/Creatinine Ratio: 13 (ref 10–24)
BUN: 13 mg/dL (ref 8–27)
CO2: 23 mmol/L (ref 20–29)
Calcium: 9.4 mg/dL (ref 8.6–10.2)
Chloride: 103 mmol/L (ref 96–106)
Creatinine: 1.01 mg/dL (ref 0.76–1.27)
Est, Glom Filt Rate: 81 mL/min/1.73 (ref >59)
Globulin, Total: 3.1 g/dL (ref 1.5–4.5)
Glucose: 87 mg/dL (ref 70–99)
Potassium: 3.8 mmol/L (ref 3.5–5.2)
Sodium: 140 mmol/L (ref 134–144)
Total Bilirubin: 0.9 mg/dL (ref 0.0–1.2)
Total Protein: 6.7 g/dL (ref 6.0–8.5)

## 2023-08-04 LAB — CBC
Hematocrit: 43.7 % (ref 37.5–51.0)
Hemoglobin: 14.3 g/dL (ref 13.0–17.7)
MCH: 28.4 pg (ref 26.6–33.0)
MCHC: 32.7 g/dL (ref 31.5–35.7)
MCV: 87 fL (ref 79–97)
Platelets: 214 x10E3/uL (ref 150–450)
RBC: 5.03 x10E6/uL (ref 4.14–5.80)
RDW: 13.6 % (ref 11.6–15.4)
WBC: 7.4 x10E3/uL (ref 3.4–10.8)

## 2023-08-04 LAB — VITAMIN D 25 HYDROXY: Vit D, 25-Hydroxy: 53.9 ng/mL (ref 30.0–100.0)

## 2023-08-04 NOTE — Other (Signed)
 Will review at follow up

## 2023-08-10 ENCOUNTER — Ambulatory Visit: Admit: 2023-08-10 | Payer: MEDICARE | Attending: Physician Assistant | Primary: Family

## 2023-08-10 VITALS — Ht 69.0 in | Wt 186.0 lb

## 2023-08-10 DIAGNOSIS — C61 Malignant neoplasm of prostate: Principal | ICD-10-CM

## 2023-08-10 LAB — AMB POC URINALYSIS DIP STICK AUTO W/O MICRO
Bilirubin, Urine, POC: NEGATIVE
Blood, Urine, POC: NEGATIVE
Glucose, Urine, POC: NEGATIVE
Ketones, Urine, POC: NEGATIVE
Leukocyte Esterase, Urine, POC: NEGATIVE
Nitrite, Urine, POC: NEGATIVE
Protein, Urine, POC: NEGATIVE
Specific Gravity, Urine, POC: 1.015 (ref 1.001–1.035)
Urobilinogen, POC: NORMAL
pH, Urine, POC: 7 (ref 4.6–8.0)

## 2023-08-10 NOTE — Progress Notes (Signed)
 Jeremy Green  Aug 27, 1955      I am following the established plan for Dr. Trudy and Dr. Trudy is the supervising physician for this day.        Chief Complaint   Patient presents with    Prostate Cancer       ICD-10-CM    1. Prostate cancer (HCC)  C61 AMB POC URINALYSIS DIP STICK AUTO W/O MICRO      2. Elevated liver enzymes  R74.8                   ASSESSMENT:  UA today: negative      1. Prostate Cancer- unknown grade [id number]   Prostatectomy in 2007, no ADT or XRT that patient aware of   Detectable PSA of 0.05 in [place] , PET scan was negative   PET scan 05/22/21- no abnormal hypermetabolic activity to suggest malignancy, of note prostate cancers are often not FDG avid   DRE 03/29/22- no palpable prostate   PSMA PET/CT 04/07/2022: Prostatectomy without evidence of tracer avid local recurrent neoplasm. A 7 mm mildly tracer avid preaortic lymph node with max SUV 3.0. Tracer avid 6 mm precaval lymph node with max SUV 3.4. Tracer avid 1.4 cm upper aortocaval lymph node with max SUV 4.5. These are concerning for tracer avid nodal metastases.   Most recent PSA 08/03/23- <0.3    2. Left Kidney Stones (no size or quantity) noted on PET 05/22/2021    3. 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 ED   Sp IPP    Last Camcevi  injection 12/29/22. Next 07/02/23    Reviewed with Dr. Trudy, Hold xtandi  for 1 month, repeat cmp if improved start at 1/2 dose. If not improved stop medication and GI work-up and try another medication.    Patient is taking black cohosh and vitamin E, started at same time as Xtandi . Black cohosh has been linked to liver issues, stopped 1 week ago, reassess liver levels in 3 weeks, if still high then stop xtandi           PLAN:    Previously DR. Unnikrishnan had a Long discussion with patient.  Reviewed his PSMA PET scan which did  reveal multiple retroperitoneal lymph nodes concerning for nodal metastatic disease.  PSA and T have appropriately responded to ADT  Camcevi  scheduled 07/02/23  Continue  xtandi  for now  AST continuing to elevated  Stop black cohosh and vitamin E- if liver levels still elevated then stop xtandi   CMP in 3 weeks on nurse schedule  Return to clinic in 5 months with PSA, testosterone, CBC, CMP, and vitamin D prior.      HISTORY OF PRESENT ILLNESS:  Jeremy Green is a 68 y.o. male who is following up today for prostate cancer with rising PSA a condition that will require long term care. Last saw Dr. Hellen 05/2022. Prostatectomy in 2007. Patient has started ADT due to METS seen on PSMA and biochemical recurrence of PSA.    Patient is having hot flashes. Previously had fatigue. Good FOS, no sense of PVR. He has been taking black cohosh and Vit E for hot flashes but stopped when he saw his liver enzymes.     Denies flank pain, gross hematuria, dysuria, asymptomatic for infection. No f/c/n/v.       No family history of prostate, bladder or kidney cancer  Former smoker: quit in 1979, smoked for 5-10 years, 0.5 PPD  No industrial chemical or radiation exposure  LABS AND IMAGING:      PSA Trend  Lab Results   Component Value Date    PSA <0.03 08/03/2023    PSA <0.1 02/09/2023    PSA <0.03 12/29/2022    PSA <0.1 09/10/2022    PSA 1.91 03/29/2022    PSA 1.5 03/19/2021    PSA 1.3 09/11/2020        Results for orders placed or performed in visit on 08/10/23   AMB POC URINALYSIS DIP STICK AUTO W/O MICRO   Result Value Ref Range    Color, Urine, POC Yellow     Clarity, Urine, POC Clear     Glucose, Urine, POC Negative     Bilirubin, Urine, POC Negative     Ketones, Urine, POC Negative     Specific Gravity, Urine, POC 1.015 1.001 - 1.035    Blood, Urine, POC Negative Negative    pH, Urine, POC 7.0 4.6 - 8.0    Protein, Urine, POC Negative     Urobilinogen, POC Normal     Nitrite, Urine, POC Negative     Leukocyte Esterase, Urine, POC Negative        PSMA PET/CT 04/07/2022  CONCLUSION:  1. Prostatectomy without evidence of tracer avid local recurrent neoplasm.  2. Tracer avid upper  retroperitoneal lymph nodes, concerning for nodal metastases.  3. Otherwise, no tracer avid distant metastatic disease.    Most recent imaging:  No image results found.        Past Medical History:   Diagnosis Date    GERD (gastroesophageal reflux disease)     HLD (hyperlipidemia)     HTN (hypertension)     Prostate cancer (HCC) 2007    prostatectomy; [place]        Past Surgical History:   Procedure Laterality Date    UROLOGICAL SURGERY  2007    prostatectomy       Social History     Tobacco Use    Smoking status: Former     Types: Cigarettes    Smokeless tobacco: Never   Substance Use Topics    Alcohol use: Never    Drug use: Never       No Known Allergies    No family history on file.    Current Outpatient Medications   Medication Sig Dispense Refill    meloxicam (MOBIC) 7.5 MG tablet Take 1 tablet by mouth daily      enzalutamide  (XTANDI ) 40 MG capsule TAKE 4 CAPSULES BY MOUTH 1 TIME A DAY AT BEDTIME 120 capsule 5    prazosin (MINIPRESS) 1 MG capsule TAKE 1 CAPSULE BY MOUTH AT BEDTIME,CAN INCREASE TO 2 CAPSULES AFTER 3 DAYS IF NEEDED FOR 30 DAYS      rosuvastatin (CRESTOR) 40 MG tablet 1 tab(s) orally once every other day      loratadine (CLARITIN) 10 MG tablet 1 tab(s) orally once a day      losartan-hydroCHLOROthiazide (HYZAAR) 50-12.5 MG per tablet Take 1 tablet by mouth daily      NIFEdipine (PROCARDIA XL) 60 MG extended release tablet Take 1 tablet by mouth daily      LOSARTAN POTASSIUM PO Take by mouth      hydroCHLOROthiazide (HYDRODIURIL) 50 MG tablet Take 1 tablet by mouth daily      pantoprazole (PROTONIX) 40 MG injection Infuse 40 mg intravenously daily      ezetimibe (ZETIA) 10 MG tablet Take 1 tablet by mouth daily      tiZANidine (ZANAFLEX) 2 MG  tablet Take 1 tablet by mouth every 6 hours as needed      degarelix  (FIRMAGON ) 120 MG/VIAL SOLR chemo injection Inject 6 mLs into the skin once for 1 dose 6 mL 0     No current facility-administered medications for this visit.             PHYSICAL  EXAMINATION:   Ht 1.753 m (5' 9)   Wt 84.4 kg (186 lb)   BMI 27.47 kg/m   Constitutional: WDWN, Pleasant and appropriate affect, No acute distress.    Respiratory: No respiratory distress or difficulties  Abdomen: protuberant abdomen  Skin: No jaundice.    Neuro/Psych:  Alert and oriented x 3, affect appropriate.   MSK: FROM      Patient's BMI is out of the normal parameters.  Information about BMI was given and patient was advised to follow-up with their PCP for further management.        A copy of today's office visit with all pertinent imaging results and labs were sent to the referring physician.        Wanda Meals, PA-C  [facility]    [address] [place], [address]  [place], [place] [address]  P: [phone]   F: [phone]

## 2023-08-15 DIAGNOSIS — H6123 Impacted cerumen, bilateral: Secondary | ICD-10-CM | POA: Diagnosis not present

## 2023-08-29 DIAGNOSIS — K080 Exfoliation of teeth due to systemic causes: Secondary | ICD-10-CM | POA: Diagnosis not present

## 2023-08-31 ENCOUNTER — Ambulatory Visit: Admit: 2023-08-31 | Discharge: 2023-08-31 | Payer: MEDICARE | Primary: Family

## 2023-08-31 DIAGNOSIS — R748 Abnormal levels of other serum enzymes: Principal | ICD-10-CM

## 2023-08-31 NOTE — Progress Notes (Signed)
 Jeremy Green presents today for lab draw per PA Aloia order.   Dr. Honore was present in the clinic as incident to.     CMP obtained via venipuncture without any difficulty.    Patient will be notified with lab results.       Orders Placed This Encounter   Procedures    COLLECTION VENOUS BLOOD,VENIPUNCTURE    Comprehensive Metabolic Panel       [name]

## 2023-09-01 LAB — COMPREHENSIVE METABOLIC PANEL
ALT: 148 IU/L — ABNORMAL HIGH (ref 0–44)
AST: 165 IU/L — ABNORMAL HIGH (ref 0–40)
Albumin: 3.9 g/dL (ref 3.9–4.9)
Alkaline Phosphatase: 140 IU/L — ABNORMAL HIGH (ref 44–121)
BUN/Creatinine Ratio: 14 (ref 10–24)
BUN: 14 mg/dL (ref 8–27)
CO2: 26 mmol/L (ref 20–29)
Calcium: 9.5 mg/dL (ref 8.6–10.2)
Chloride: 101 mmol/L (ref 96–106)
Creatinine: 0.99 mg/dL (ref 0.76–1.27)
Est, Glom Filt Rate: 82 mL/min/1.73 (ref >59)
Globulin, Total: 3.2 g/dL (ref 1.5–4.5)
Glucose: 81 mg/dL (ref 70–99)
Potassium: 3.7 mmol/L (ref 3.5–5.2)
Sodium: 142 mmol/L (ref 134–144)
Total Bilirubin: 0.8 mg/dL (ref 0.0–1.2)
Total Protein: 7.1 g/dL (ref 6.0–8.5)

## 2023-09-01 NOTE — Other (Signed)
 Liver levels are still high despite stopping the supplements. Stop the Xtandi  at this time and recheck CMP in 3 weeks on nurse schedule.

## 2023-09-01 NOTE — Telephone Encounter (Signed)
 Pt called in ask questions concerning his medications, and if whether he should d/c due to his results?    Informed patient note was left by Aloia routed to her nurse to relay to him, but she has not got around to make calls at the moment and I can relay instead. Message below was given:    Liver levels are still high despite stopping the supplements. Stop the Xtandi  at this time and recheck CMP in 3 weeks on nurse schedule.    Scheduled pt on nurse schedule for 09/26/23 @ 9:10am.    Pt said, he will d/c Xtandi  today.   Pt thanked me and call was ended.

## 2023-09-26 ENCOUNTER — Ambulatory Visit: Admit: 2023-09-26 | Discharge: 2023-09-26 | Payer: MEDICARE | Primary: Family

## 2023-09-26 DIAGNOSIS — R748 Abnormal levels of other serum enzymes: Principal | ICD-10-CM

## 2023-09-26 NOTE — Progress Notes (Signed)
 Jeremy Green presents today for lab draw per PA Aloia order.   PA Beverlee was present in the clinic as incident to.     CMP obtained via venipuncture without any difficulty.    Patient will be notified with lab results.       Orders Placed This Encounter   Procedures    COLLECTION VENOUS BLOOD,VENIPUNCTURE    Comprehensive Metabolic Panel       [name]

## 2023-09-27 LAB — COMPREHENSIVE METABOLIC PANEL
ALT: 139 IU/L — ABNORMAL HIGH (ref 0–44)
AST: 146 IU/L — ABNORMAL HIGH (ref 0–40)
Albumin: 3.8 g/dL — ABNORMAL LOW (ref 3.9–4.9)
Alkaline Phosphatase: 133 IU/L — ABNORMAL HIGH (ref 47–123)
BUN/Creatinine Ratio: 19 (ref 10–24)
BUN: 20 mg/dL (ref 8–27)
CO2: 25 mmol/L (ref 20–29)
Calcium: 9.4 mg/dL (ref 8.6–10.2)
Chloride: 103 mmol/L (ref 96–106)
Creatinine: 1.05 mg/dL (ref 0.76–1.27)
Est, Glom Filt Rate: 77 mL/min/1.73 (ref >59)
Globulin, Total: 3.3 g/dL (ref 1.5–4.5)
Glucose: 78 mg/dL (ref 70–99)
Potassium: 3.4 mmol/L — ABNORMAL LOW (ref 3.5–5.2)
Sodium: 141 mmol/L (ref 134–144)
Total Bilirubin: 1 mg/dL (ref 0.0–1.2)
Total Protein: 7.1 g/dL (ref 6.0–8.5)

## 2023-10-04 NOTE — Other (Signed)
 Continue to hold xtandi  based on the recent liver enzymes. Will repeat labs in ~2 weeks until back to baseline.

## 2023-10-05 NOTE — Other (Signed)
 S/w patient, he resumed Xtandi  after labs were drawn on 9/16. Advised liver enzymes remain elevated Dr. Trudy recommends he hold until we can repeat CMP in ~2 weeks. Appt scheduled.    Alan Saliva, MA

## 2023-10-10 LAB — HEMOGLOBIN A1C
Estimated Avg Glucose, External: 110 mg/dL (ref 91–123)
Hemoglobin A1C, External: 5.5 % (ref 4.8–5.6)

## 2023-10-13 NOTE — Telephone Encounter (Signed)
 Pt currently holding Xtandi . Refill denied.     Cena Bullock, LPN

## 2023-10-21 ENCOUNTER — Ambulatory Visit: Admit: 2023-10-21 | Discharge: 2023-10-21 | Payer: MEDICARE | Primary: Family

## 2023-10-21 DIAGNOSIS — C61 Malignant neoplasm of prostate: Principal | ICD-10-CM

## 2023-10-21 NOTE — Progress Notes (Signed)
"  Jeremy Green presents today for lab draw per Dr. Trudy order.   Dr. Zeno was present in the clinic as incident to.     CMP obtained via venipuncture without any difficulty.    Patient will be notified with lab results.       Orders Placed This Encounter   Procedures    COLLECTION VENOUS BLOOD,VENIPUNCTURE    Comprehensive Metabolic Panel       Jeremy Green   "

## 2023-10-22 LAB — COMPREHENSIVE METABOLIC PANEL
ALT: 91 IU/L — ABNORMAL HIGH (ref 0–44)
AST: 105 IU/L — ABNORMAL HIGH (ref 0–40)
Albumin: 3.6 g/dL — ABNORMAL LOW (ref 3.9–4.9)
Alkaline Phosphatase: 131 IU/L — ABNORMAL HIGH (ref 47–123)
BUN/Creatinine Ratio: 16 (ref 10–24)
BUN: 17 mg/dL (ref 8–27)
CO2: 27 mmol/L (ref 20–29)
Calcium: 9.5 mg/dL (ref 8.6–10.2)
Chloride: 102 mmol/L (ref 96–106)
Creatinine: 1.07 mg/dL (ref 0.76–1.27)
Est, Glom Filt Rate: 75 mL/min/1.73 (ref >59)
Globulin, Total: 3.2 g/dL (ref 1.5–4.5)
Glucose: 92 mg/dL (ref 70–99)
Potassium: 3.6 mmol/L (ref 3.5–5.2)
Sodium: 141 mmol/L (ref 134–144)
Total Bilirubin: 1.2 mg/dL (ref 0.0–1.2)
Total Protein: 6.8 g/dL (ref 6.0–8.5)

## 2023-10-31 NOTE — Telephone Encounter (Signed)
"  Please advise patient of his Comprehensive Metabolic Panel results. Thank you.    [phone]  "

## 2023-10-31 NOTE — Telephone Encounter (Signed)
"  Pharmacy call regarding the refill request for XTANDI ;    Please send it to [facility].    Tel #: [phone]  "

## 2023-11-03 ENCOUNTER — Encounter

## 2023-11-03 NOTE — Telephone Encounter (Signed)
"  No per Dr. Hellen and Dr. Trudy, he needs to continue to hold on the Xtandi  because his liver levels are still elevated. He needs referral to GI to assess his liver. Referral placed for group in [place].     Pt informed , he thanked me and the call was ended  "

## 2023-11-13 NOTE — Result Encounter Note (Signed)
"  Your liver function tests are slowly getting back to normal. Continue to NOT take Xtandi  for now.  "

## 2023-11-14 NOTE — Telephone Encounter (Addendum)
"  Patient notified. Combined camcevi  and lab appts. Appt cancelled on 12/31. Thanked me and the call was ended.     ----- Message from Dr. Ozell Pouch, MD sent at 11/13/2023  6:30 PM EST -----  Your liver function tests are slowly getting back to normal. Continue to NOT take Xtandi  for now.  ----- Message -----  From: Edi, [facility] Incoming Amb Ref Lab Orders To [facility]  Sent: 10/22/2023   7:36 AM EST  To: Ozell KATHEE Pouch, MD    "

## 2023-11-23 DIAGNOSIS — Z113 Encounter for screening for infections with a predominantly sexual mode of transmission: Secondary | ICD-10-CM | POA: Diagnosis not present

## 2023-11-23 DIAGNOSIS — Z125 Encounter for screening for malignant neoplasm of prostate: Secondary | ICD-10-CM | POA: Diagnosis not present

## 2023-11-23 DIAGNOSIS — Z1389 Encounter for screening for other disorder: Secondary | ICD-10-CM | POA: Diagnosis not present

## 2023-11-23 DIAGNOSIS — E039 Hypothyroidism, unspecified: Secondary | ICD-10-CM | POA: Diagnosis not present

## 2023-11-25 DIAGNOSIS — H524 Presbyopia: Secondary | ICD-10-CM | POA: Diagnosis not present

## 2023-11-28 DIAGNOSIS — R82998 Other abnormal findings in urine: Secondary | ICD-10-CM | POA: Diagnosis not present

## 2023-11-30 DIAGNOSIS — Z23 Encounter for immunization: Secondary | ICD-10-CM | POA: Diagnosis not present

## 2023-11-30 DIAGNOSIS — E039 Hypothyroidism, unspecified: Secondary | ICD-10-CM | POA: Diagnosis not present

## 2023-11-30 DIAGNOSIS — Z1339 Encounter for screening examination for other mental health and behavioral disorders: Secondary | ICD-10-CM | POA: Diagnosis not present

## 2023-11-30 DIAGNOSIS — Z1331 Encounter for screening for depression: Secondary | ICD-10-CM | POA: Diagnosis not present

## 2023-11-30 DIAGNOSIS — Z0000 Encounter for general adult medical examination without abnormal findings: Secondary | ICD-10-CM | POA: Diagnosis not present

## 2023-12-15 DIAGNOSIS — H6123 Impacted cerumen, bilateral: Secondary | ICD-10-CM | POA: Diagnosis not present

## 2023-12-20 DIAGNOSIS — K080 Exfoliation of teeth due to systemic causes: Secondary | ICD-10-CM | POA: Diagnosis not present

## 2024-01-04 ENCOUNTER — Ambulatory Visit: Admit: 2024-01-04 | Discharge: 2024-01-04 | Payer: MEDICARE | Primary: Family

## 2024-01-04 DIAGNOSIS — C61 Malignant neoplasm of prostate: Principal | ICD-10-CM

## 2024-01-04 MED ORDER — LEUPROLIDE MESYLATE (6 MONTH) 42 MG SC PRSY
42 | Freq: Once | SUBCUTANEOUS | 0 refills | Status: AC
Start: 2024-01-04 — End: 2024-01-17

## 2024-01-04 NOTE — Progress Notes (Signed)
"  Jeremy Green is a 68 y.o. male who is here today per the order of Dr. Trudy to receive CAMCEVI  and to have labs drawn.   Patient's identity has been verified.   Dr. Trudy was available in the clinic as incident to provider.     Patient has been taking supplements of at least 500 mg of calcium and 400 international units of Vitamin D daily.  This is not his first ADT injection.  Last injection:18June2025.  He has  had the necessary labs drawn.  ADT labs obtained via venipuncture without any difficulty.  Patient will be notified with lab results.    CAMCEVI  42 mg/mL is administered to abdomen ( right) SQ without difficulty.     Patient tolerated the injection well.    Possible side effects reviewed with patient YES  Patient has been scheduled for the next injection which will be due in approximately 6 months.      Encounter Diagnoses   Name Primary?    Prostate cancer (HCC) Yes    Encounter for long-term (current) use of high-risk medication     Vitamin D deficiency     Calcium deficiency        Orders Placed This Encounter   Procedures    COLLECTION VENOUS BLOOD,VENIPUNCTURE    PSA,  (Diagnostic [facility])    Testosterone    CBC    Comprehensive Metabolic Panel    Vitamin D 25 Hydroxy    PR LEUPROLIDE  INJ, CAMCEVI , 1MG      Dose:   42 mg/mL     Site:   RIGHT LOWER QUAD ABDOMEN     Expiration Date:   11/10/2024     Lot#:   [id number]     Manufacturer:   [employer]     Oceanographer?:   6     Perfomed by/Witnessed by::   Holli PARAS     NDC#:   30551-976-36 [[id number]]    CHEMOTHER HORMON ANTINEOPL SUB-Q/IM       Patient did not supply own medication.      [name]       Reviewed by OZELL KATHEE TRUDY, MD    "

## 2024-01-05 LAB — COMPREHENSIVE METABOLIC PANEL
ALT: 67 IU/L — ABNORMAL HIGH (ref 0–44)
AST: 83 IU/L — ABNORMAL HIGH (ref 0–40)
Albumin: 3.7 g/dL — ABNORMAL LOW (ref 3.9–4.9)
Alkaline Phosphatase: 113 IU/L (ref 47–123)
BUN/Creatinine Ratio: 17 (ref 10–24)
BUN: 19 mg/dL (ref 8–27)
CO2: 25 mmol/L (ref 20–29)
Calcium: 9.3 mg/dL (ref 8.6–10.2)
Chloride: 104 mmol/L (ref 96–106)
Creatinine: 1.09 mg/dL (ref 0.76–1.27)
Est, Glom Filt Rate: 73 mL/min/1.73 (ref >59)
Globulin, Total: 3.1 g/dL (ref 1.5–4.5)
Glucose: 119 mg/dL — ABNORMAL HIGH (ref 70–99)
Potassium: 3.5 mmol/L (ref 3.5–5.2)
Sodium: 144 mmol/L (ref 134–144)
Total Bilirubin: 0.7 mg/dL (ref 0.0–1.2)
Total Protein: 6.8 g/dL (ref 6.0–8.5)

## 2024-01-05 LAB — CBC
Hematocrit: 39.7 % (ref 37.5–51.0)
Hemoglobin: 13 g/dL (ref 13.0–17.7)
MCH: 29.2 pg (ref 26.6–33.0)
MCHC: 32.7 g/dL (ref 31.5–35.7)
MCV: 89 fL (ref 79–97)
Platelets: 208 x10E3/uL (ref 150–450)
RBC: 4.45 x10E6/uL (ref 4.14–5.80)
RDW: 12.9 % (ref 11.6–15.4)
WBC: 7.4 x10E3/uL (ref 3.4–10.8)

## 2024-01-05 LAB — VITAMIN D 25 HYDROXY: Vit D, 25-Hydroxy: 32.2 ng/mL (ref 30.0–100.0)

## 2024-01-06 LAB — PSA,  (DIAGNOSTIC UVA): PSA: <0.03 ng/mL (ref 0–4)

## 2024-01-06 LAB — TESTOSTERONE: Testosterone: <3 ng/dL — ABNORMAL LOW (ref 348–1197)

## 2024-01-11 ENCOUNTER — Encounter: Primary: Family

## 2024-01-17 ENCOUNTER — Ambulatory Visit: Admit: 2024-01-17 | Payer: MEDICARE | Attending: Physician Assistant | Primary: Family

## 2024-01-17 VITALS — Ht 69.0 in | Wt 187.0 lb

## 2024-01-17 DIAGNOSIS — C61 Malignant neoplasm of prostate: Principal | ICD-10-CM

## 2024-01-17 NOTE — Result Encounter Note (Signed)
 Liver function tests are continuing to improve. Please continue to hold Xtandi  (do not take).

## 2024-01-17 NOTE — Progress Notes (Signed)
 Jeremy Green  16-Jul-1955      I am following the established plan for Dr. Trudy and Dr. Zeno is the supervising physician for this day.        Chief Complaint   Patient presents with    Prostate Cancer       ICD-10-CM    1. Prostate cancer (HCC)  C61               ASSESSMENT:  No UA today      1. Prostate Cancer- unknown grade [id number]   Prostatectomy in 2007, no ADT or XRT that patient aware of   Detectable PSA of 0.05 in [place] , PET scan was negative   PET scan 05/22/21- no abnormal hypermetabolic activity to suggest malignancy, of note prostate cancers are often not FDG avid   DRE 03/29/22- no palpable prostate   PSMA PET/CT 04/07/2022: Prostatectomy without evidence of tracer avid local recurrent neoplasm. A 7 mm mildly tracer avid preaortic lymph node with max SUV 3.0. Tracer avid 6 mm precaval lymph node with max SUV 3.4. Tracer avid 1.4 cm upper aortocaval lymph node with max SUV 4.5. These are concerning for tracer avid nodal metastases.   Most recent PSA 01/04/24- <0.3    2. Left Kidney Stones (no size or quantity) noted on PET 05/22/2021    3. 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 ED   Sp IPP    Last Camcevi  injection 01/04/24. Next 07/04/24      Per Dr. Trudy, liver function tests are slowly getting back to normal. Continue to NOT take Xtandi  for now       PLAN:    Previously DR. Unnikrishnan had a Long discussion with patient. Reviewed his PSMA PET scan which did  reveal multiple retroperitoneal lymph nodes concerning for nodal metastatic disease.  PSA and T have appropriately responded to ADT  Camcevi  scheduled 07/04/24  Hold Xandi- LFTs improving, still high  3 months on nurse schedule- PSA, testosterone, CBC, CMP, and vitamin D prior.  Return to clinic in 6 months with PSA, testosterone, CBC, CMP, and vitamin D prior.      HISTORY OF PRESENT ILLNESS:  Jeremy Green is a 69 y.o. male who is following up today for prostate cancer with rising PSA a condition that will require long term care.  Last saw Dr. Hellen 05/2022. Prostatectomy in 2007. Patient has started ADT due to METS seen on PSMA and biochemical recurrence of PSA.    Patient is having hot flashes- improved. Previously had fatigue. Good FOS, no sense of PVR. No longer on vit E, black cohosh and Xtandi  due to LFTs.     Denies flank pain, gross hematuria, dysuria, asymptomatic for infection. No f/c/n/v.       No family history of prostate, bladder or kidney cancer  Former smoker: quit in 1979, smoked for 5-10 years, 0.5 PPD  No industrial chemical or radiation exposure        LABS AND IMAGING:      PSA Trend  Lab Results   Component Value Date    PSA <0.03 01/04/2024    PSA <0.03 08/03/2023    PSA <0.1 02/09/2023    PSA <0.03 12/29/2022    PSA <0.1 09/10/2022    PSA 1.91 03/29/2022    PSA 1.5 03/19/2021        No results found for this visit on 01/17/24.      PSMA PET/CT 04/07/2022  CONCLUSION:  1. Prostatectomy  without evidence of tracer avid local recurrent neoplasm.  2. Tracer avid upper retroperitoneal lymph nodes, concerning for nodal metastases.  3. Otherwise, no tracer avid distant metastatic disease.    Most recent imaging:  No image results found.        Past Medical History:   Diagnosis Date    GERD (gastroesophageal reflux disease)     HLD (hyperlipidemia)     HTN (hypertension)     Prostate cancer (HCC) 2007    prostatectomy; [place]        Past Surgical History:   Procedure Laterality Date    UROLOGICAL SURGERY  2007    prostatectomy       Social History     Tobacco Use    Smoking status: Former     Types: Cigarettes    Smokeless tobacco: Never   Substance Use Topics    Alcohol use: Never    Drug use: Never       No Known Allergies    No family history on file.    Current Outpatient Medications   Medication Sig Dispense Refill    leuprolide  mesylate, 6 Month, (CAMCEVI ) 42 MG prefilled syringe Inject 42 mg into the skin once for 1 dose 1 each 0    meloxicam (MOBIC) 7.5 MG tablet Take 1 tablet by mouth daily      enzalutamide   (XTANDI ) 40 MG capsule TAKE 4 CAPSULES BY MOUTH 1 TIME A DAY AT BEDTIME 120 capsule 5    prazosin (MINIPRESS) 1 MG capsule TAKE 1 CAPSULE BY MOUTH AT BEDTIME,CAN INCREASE TO 2 CAPSULES AFTER 3 DAYS IF NEEDED FOR 30 DAYS      rosuvastatin (CRESTOR) 40 MG tablet 1 tab(s) orally once every other day      loratadine (CLARITIN) 10 MG tablet 1 tab(s) orally once a day      degarelix  (FIRMAGON ) 120 MG/VIAL SOLR chemo injection Inject 6 mLs into the skin once for 1 dose 6 mL 0    losartan-hydroCHLOROthiazide (HYZAAR) 50-12.5 MG per tablet Take 1 tablet by mouth daily      NIFEdipine (PROCARDIA XL) 60 MG extended release tablet Take 1 tablet by mouth daily      LOSARTAN POTASSIUM PO Take by mouth      hydroCHLOROthiazide (HYDRODIURIL) 50 MG tablet Take 1 tablet by mouth daily      pantoprazole (PROTONIX) 40 MG injection Infuse 40 mg intravenously daily      ezetimibe (ZETIA) 10 MG tablet Take 1 tablet by mouth daily      tiZANidine (ZANAFLEX) 2 MG tablet Take 1 tablet by mouth every 6 hours as needed       No current facility-administered medications for this visit.             PHYSICAL EXAMINATION:   Ht 1.753 m (5' 9)   Wt 84.8 kg (187 lb)   BMI 27.62 kg/m   Constitutional: WDWN, Pleasant and appropriate affect, No acute distress.    Respiratory: No respiratory distress or difficulties  Abdomen: protuberant abdomen  Skin: No jaundice.    Neuro/Psych:  Alert and oriented x 3, affect appropriate.   MSK: FROM      Patient's BMI is out of the normal parameters.  Information about BMI was given and patient was advised to follow-up with their PCP for further management.        A copy of today's office visit with all pertinent imaging results and labs were sent to the referring physician.  Wanda Meals, PA-C  [facility]    [address] [place], [address]  [place], [place] [address]  P: [phone]   F: [phone]
